# Patient Record
Sex: Female | Born: 1955 | Race: White | Hispanic: No | Marital: Married | Smoking: Current some day smoker
Health system: Southern US, Community
[De-identification: ages and names within clinical notes are randomized; demographics above are authoritative.]

## PROBLEM LIST (undated history)

## (undated) DIAGNOSIS — I10 Essential (primary) hypertension: Secondary | ICD-10-CM

## (undated) DIAGNOSIS — F419 Anxiety disorder, unspecified: Secondary | ICD-10-CM

## (undated) DIAGNOSIS — N189 Chronic kidney disease, unspecified: Secondary | ICD-10-CM

---

## 2016-01-09 ENCOUNTER — Encounter (HOSPITAL_COMMUNITY): Payer: Self-pay | Admitting: *Deleted

## 2016-01-09 ENCOUNTER — Emergency Department (HOSPITAL_COMMUNITY): Payer: Medicare HMO | Admitting: Anesthesiology

## 2016-01-09 ENCOUNTER — Emergency Department (HOSPITAL_COMMUNITY): Payer: Medicare HMO

## 2016-01-09 ENCOUNTER — Encounter (HOSPITAL_COMMUNITY)
Admission: EM | Disposition: A | Discharge status: Skilled Nursing Facility | Payer: Self-pay | Source: Home / Self Care | Attending: Orthopedic Surgery

## 2016-01-09 ENCOUNTER — Inpatient Hospital Stay (HOSPITAL_COMMUNITY)
Admission: EM | Admit: 2016-01-09 | Discharge: 2016-01-17 | DRG: 501 | Disposition: A | Discharge status: Skilled Nursing Facility | Payer: Medicare HMO | Attending: Orthopedic Surgery | Admitting: Orthopedic Surgery

## 2016-01-09 DIAGNOSIS — F411 Generalized anxiety disorder: Secondary | ICD-10-CM | POA: Diagnosis present

## 2016-01-09 DIAGNOSIS — W19XXXA Unspecified fall, initial encounter: Secondary | ICD-10-CM

## 2016-01-09 DIAGNOSIS — Y92009 Unspecified place in unspecified non-institutional (private) residence as the place of occurrence of the external cause: Secondary | ICD-10-CM

## 2016-01-09 DIAGNOSIS — S52502B Unspecified fracture of the lower end of left radius, initial encounter for open fracture type I or II: Principal | ICD-10-CM | POA: Diagnosis present

## 2016-01-09 DIAGNOSIS — N183 Chronic kidney disease, stage 3 unspecified: Secondary | ICD-10-CM | POA: Diagnosis present

## 2016-01-09 DIAGNOSIS — R296 Repeated falls: Secondary | ICD-10-CM | POA: Diagnosis present

## 2016-01-09 DIAGNOSIS — S52009A Unspecified fracture of upper end of unspecified ulna, initial encounter for closed fracture: Secondary | ICD-10-CM | POA: Diagnosis present

## 2016-01-09 DIAGNOSIS — S52109A Unspecified fracture of upper end of unspecified radius, initial encounter for closed fracture: Secondary | ICD-10-CM

## 2016-01-09 DIAGNOSIS — F329 Major depressive disorder, single episode, unspecified: Secondary | ICD-10-CM | POA: Diagnosis present

## 2016-01-09 DIAGNOSIS — I129 Hypertensive chronic kidney disease with stage 1 through stage 4 chronic kidney disease, or unspecified chronic kidney disease: Secondary | ICD-10-CM | POA: Diagnosis present

## 2016-01-09 DIAGNOSIS — S52002B Unspecified fracture of upper end of left ulna, initial encounter for open fracture type I or II: Secondary | ICD-10-CM | POA: Diagnosis present

## 2016-01-09 DIAGNOSIS — E7800 Pure hypercholesterolemia, unspecified: Secondary | ICD-10-CM | POA: Diagnosis present

## 2016-01-09 DIAGNOSIS — F419 Anxiety disorder, unspecified: Secondary | ICD-10-CM | POA: Diagnosis present

## 2016-01-09 DIAGNOSIS — F17200 Nicotine dependence, unspecified, uncomplicated: Secondary | ICD-10-CM | POA: Diagnosis present

## 2016-01-09 DIAGNOSIS — F1910 Other psychoactive substance abuse, uncomplicated: Secondary | ICD-10-CM | POA: Diagnosis present

## 2016-01-09 DIAGNOSIS — S52202B Unspecified fracture of shaft of left ulna, initial encounter for open fracture type I or II: Secondary | ICD-10-CM

## 2016-01-09 DIAGNOSIS — Z87820 Personal history of traumatic brain injury: Secondary | ICD-10-CM

## 2016-01-09 DIAGNOSIS — S5292XB Unspecified fracture of left forearm, initial encounter for open fracture type I or II: Secondary | ICD-10-CM

## 2016-01-09 DIAGNOSIS — W109XXA Fall (on) (from) unspecified stairs and steps, initial encounter: Secondary | ICD-10-CM | POA: Diagnosis present

## 2016-01-09 DIAGNOSIS — N179 Acute kidney failure, unspecified: Secondary | ICD-10-CM | POA: Diagnosis present

## 2016-01-09 DIAGNOSIS — F0390 Unspecified dementia without behavioral disturbance: Secondary | ICD-10-CM | POA: Diagnosis present

## 2016-01-09 DIAGNOSIS — I10 Essential (primary) hypertension: Secondary | ICD-10-CM | POA: Diagnosis present

## 2016-01-09 DIAGNOSIS — G4700 Insomnia, unspecified: Secondary | ICD-10-CM | POA: Diagnosis present

## 2016-01-09 HISTORY — DX: Anxiety disorder, unspecified: F41.9

## 2016-01-09 HISTORY — PX: I & D EXTREMITY: SHX5045

## 2016-01-09 HISTORY — PX: ORIF WRIST FRACTURE: SHX2133

## 2016-01-09 HISTORY — DX: Chronic kidney disease, unspecified: N18.9

## 2016-01-09 HISTORY — DX: Essential (primary) hypertension: I10

## 2016-01-09 LAB — CBC WITH DIFFERENTIAL/PLATELET
BASOS ABS: 0 10*3/uL (ref 0.0–0.1)
BASOS PCT: 1 %
EOS ABS: 0.2 10*3/uL (ref 0.0–0.7)
Eosinophils Relative: 2 %
HEMATOCRIT: 43.7 % (ref 36.0–46.0)
HEMOGLOBIN: 13.7 g/dL (ref 12.0–15.0)
Lymphocytes Relative: 25 %
Lymphs Abs: 2.2 10*3/uL (ref 0.7–4.0)
MCH: 29 pg (ref 26.0–34.0)
MCHC: 31.4 g/dL (ref 30.0–36.0)
MCV: 92.4 fL (ref 78.0–100.0)
MONOS PCT: 5 %
Monocytes Absolute: 0.5 10*3/uL (ref 0.1–1.0)
NEUTROS ABS: 5.8 10*3/uL (ref 1.7–7.7)
NEUTROS PCT: 67 %
Platelets: 255 10*3/uL (ref 150–400)
RBC: 4.73 MIL/uL (ref 3.87–5.11)
RDW: 13.9 % (ref 11.5–15.5)
WBC: 8.7 10*3/uL (ref 4.0–10.5)

## 2016-01-09 LAB — BASIC METABOLIC PANEL
ANION GAP: 13 (ref 5–15)
BUN: 19 mg/dL (ref 6–20)
CHLORIDE: 105 mmol/L (ref 101–111)
CO2: 22 mmol/L (ref 22–32)
Calcium: 9.1 mg/dL (ref 8.9–10.3)
Creatinine, Ser: 1.27 mg/dL — ABNORMAL HIGH (ref 0.44–1.00)
GFR calc non Af Amer: 45 mL/min — ABNORMAL LOW (ref >60)
GFR, EST AFRICAN AMERICAN: 52 mL/min — AB (ref >60)
Glucose, Bld: 135 mg/dL — ABNORMAL HIGH (ref 65–99)
POTASSIUM: 3.7 mmol/L (ref 3.5–5.1)
SODIUM: 140 mmol/L (ref 135–145)

## 2016-01-09 SURGERY — OPEN REDUCTION INTERNAL FIXATION (ORIF) WRIST FRACTURE
Anesthesia: General | Site: Wrist | Laterality: Left

## 2016-01-09 MED ORDER — OXYCODONE HCL 5 MG PO TABS
ORAL_TABLET | ORAL | Status: AC
Start: 2016-01-09 — End: 2016-01-09
  Filled 2016-01-09: qty 2

## 2016-01-09 MED ORDER — ONDANSETRON HCL 4 MG/2ML IJ SOLN
INTRAMUSCULAR | Status: AC
  Filled 2016-01-09: qty 2

## 2016-01-09 MED ORDER — PROPOFOL 10 MG/ML IV BOLUS
INTRAVENOUS | Status: AC
  Filled 2016-01-09: qty 20

## 2016-01-09 MED ORDER — HYDROMORPHONE HCL 1 MG/ML IJ SOLN
0.5000 mg | Freq: Once | INTRAMUSCULAR | Status: AC
  Administered 2016-01-09: 0.5 mg via INTRAVENOUS
  Filled 2016-01-09: qty 1

## 2016-01-09 MED ORDER — OXYCODONE HCL 5 MG/5ML PO SOLN: 5.0000 mg | Freq: Once | ORAL | Status: DC | PRN

## 2016-01-09 MED ORDER — PROPOFOL 10 MG/ML IV BOLUS
INTRAVENOUS | Status: DC | PRN
  Administered 2016-01-09: 200 mg via INTRAVENOUS

## 2016-01-09 MED ORDER — OXYCODONE HCL 5 MG PO TABS
5.0000 mg | ORAL_TABLET | ORAL | Status: DC | PRN
  Administered 2016-01-09 – 2016-01-16 (×20): 10 mg via ORAL
  Filled 2016-01-09 (×20): qty 2

## 2016-01-09 MED ORDER — TRAZODONE HCL 100 MG PO TABS
100.0000 mg | ORAL_TABLET | Freq: Every day | ORAL | Status: DC
  Administered 2016-01-10 – 2016-01-16 (×7): 100 mg via ORAL
  Filled 2016-01-09 (×7): qty 1

## 2016-01-09 MED ORDER — LACTATED RINGERS IV SOLN
INTRAVENOUS | Status: DC | PRN
  Administered 2016-01-09 (×2): via INTRAVENOUS

## 2016-01-09 MED ORDER — HYDROCHLOROTHIAZIDE 25 MG PO TABS: 25.0000 mg | ORAL_TABLET | Freq: Every day | ORAL | Status: DC

## 2016-01-09 MED ORDER — SUCCINYLCHOLINE CHLORIDE 20 MG/ML IJ SOLN
INTRAMUSCULAR | Status: AC
  Filled 2016-01-09: qty 1

## 2016-01-09 MED ORDER — LIDOCAINE HCL (CARDIAC) 20 MG/ML IV SOLN
INTRAVENOUS | Status: AC
  Filled 2016-01-09: qty 5

## 2016-01-09 MED ORDER — AMLODIPINE BESYLATE 10 MG PO TABS
10.0000 mg | ORAL_TABLET | Freq: Every day | ORAL | Status: DC
  Administered 2016-01-10 – 2016-01-16 (×6): 10 mg via ORAL
  Filled 2016-01-09 (×7): qty 1

## 2016-01-09 MED ORDER — FENTANYL CITRATE (PF) 250 MCG/5ML IJ SOLN
INTRAMUSCULAR | Status: AC
  Filled 2016-01-09: qty 5

## 2016-01-09 MED ORDER — SODIUM CHLORIDE 0.9 % IR SOLN
Status: DC | PRN
  Administered 2016-01-09 (×2): 3000 mL
  Administered 2016-01-09: 1000 mL
  Administered 2016-01-09 (×2): 3000 mL

## 2016-01-09 MED ORDER — MIDAZOLAM HCL 2 MG/2ML IJ SOLN
INTRAMUSCULAR | Status: AC
  Filled 2016-01-09: qty 2

## 2016-01-09 MED ORDER — SUCCINYLCHOLINE CHLORIDE 20 MG/ML IJ SOLN
INTRAMUSCULAR | Status: DC | PRN
  Administered 2016-01-09: 120 mg via INTRAVENOUS

## 2016-01-09 MED ORDER — FENTANYL CITRATE (PF) 250 MCG/5ML IJ SOLN
INTRAMUSCULAR | Status: DC | PRN
  Administered 2016-01-09: 100 ug via INTRAVENOUS
  Administered 2016-01-09: 50 ug via INTRAVENOUS
  Administered 2016-01-09: 100 ug via INTRAVENOUS

## 2016-01-09 MED ORDER — OXYCODONE HCL 5 MG PO TABS: 5.0000 mg | ORAL_TABLET | Freq: Once | ORAL | Status: DC | PRN

## 2016-01-09 MED ORDER — EPHEDRINE SULFATE 50 MG/ML IJ SOLN
INTRAMUSCULAR | Status: DC | PRN
  Administered 2016-01-09 (×2): 10 mg via INTRAVENOUS

## 2016-01-09 MED ORDER — PRAVASTATIN SODIUM 40 MG PO TABS
40.0000 mg | ORAL_TABLET | Freq: Every day | ORAL | Status: DC
  Administered 2016-01-10 – 2016-01-16 (×7): 40 mg via ORAL
  Filled 2016-01-09 (×7): qty 1

## 2016-01-09 MED ORDER — HYDROMORPHONE HCL 1 MG/ML IJ SOLN: 0.2500 mg | INTRAMUSCULAR | Status: DC | PRN

## 2016-01-09 MED ORDER — IRBESARTAN 300 MG PO TABS
300.0000 mg | ORAL_TABLET | Freq: Every day | ORAL | Status: DC
  Administered 2016-01-10: 300 mg via ORAL
  Filled 2016-01-09: qty 1

## 2016-01-09 MED ORDER — ONDANSETRON HCL 4 MG/2ML IJ SOLN
INTRAMUSCULAR | Status: DC | PRN
  Administered 2016-01-09: 4 mg via INTRAVENOUS

## 2016-01-09 MED ORDER — LIDOCAINE HCL (CARDIAC) 20 MG/ML IV SOLN
INTRAVENOUS | Status: DC | PRN
  Administered 2016-01-09: 100 mg via INTRATRACHEAL

## 2016-01-09 MED ORDER — TETANUS-DIPHTH-ACELL PERTUSSIS 5-2.5-18.5 LF-MCG/0.5 IM SUSP
0.5000 mL | Freq: Once | INTRAMUSCULAR | Status: AC
  Administered 2016-01-09: 0.5 mL via INTRAMUSCULAR
  Filled 2016-01-09: qty 0.5

## 2016-01-09 MED ORDER — MEPERIDINE HCL 25 MG/ML IJ SOLN: 6.2500 mg | INTRAMUSCULAR | Status: DC | PRN

## 2016-01-09 MED ORDER — CEFAZOLIN SODIUM-DEXTROSE 2-4 GM/100ML-% IV SOLN
2.0000 g | Freq: Once | INTRAVENOUS | Status: AC
  Administered 2016-01-09: 2 g via INTRAVENOUS
  Filled 2016-01-09: qty 100

## 2016-01-09 SURGICAL SUPPLY — 82 items
BANDAGE ELASTIC 3 VELCRO ST LF (GAUZE/BANDAGES/DRESSINGS) ×3 IMPLANT
BANDAGE ELASTIC 4 VELCRO ST LF (GAUZE/BANDAGES/DRESSINGS) ×3 IMPLANT
BIT DRILL 2.2 SS TIBIAL (BIT) ×6 IMPLANT
BLADE SURG ROTATE 9660 (MISCELLANEOUS) IMPLANT
BNDG CONFORM 2 STRL LF (GAUZE/BANDAGES/DRESSINGS) IMPLANT
BNDG ESMARK 4X9 LF (GAUZE/BANDAGES/DRESSINGS) ×3 IMPLANT
BNDG GAUZE ELAST 4 BULKY (GAUZE/BANDAGES/DRESSINGS) ×3 IMPLANT
CANISTER SUCTION 2500CC (MISCELLANEOUS) ×3 IMPLANT
CORDS BIPOLAR (ELECTRODE) ×3 IMPLANT
COVER SURGICAL LIGHT HANDLE (MISCELLANEOUS) ×3 IMPLANT
CUFF TOURNIQUET SINGLE 18IN (TOURNIQUET CUFF) ×3 IMPLANT
CUFF TOURNIQUET SINGLE 24IN (TOURNIQUET CUFF) IMPLANT
DRAIN TLS ROUND 10FR (DRAIN) ×3 IMPLANT
DRAPE OEC MINIVIEW 54X84 (DRAPES) IMPLANT
DRAPE SURG 17X23 STRL (DRAPES) ×3 IMPLANT
DRSG ADAPTIC 3X8 NADH LF (GAUZE/BANDAGES/DRESSINGS) ×3 IMPLANT
GAUZE SPONGE 4X4 12PLY STRL (GAUZE/BANDAGES/DRESSINGS) ×3 IMPLANT
GAUZE XEROFORM 1X8 LF (GAUZE/BANDAGES/DRESSINGS) IMPLANT
GAUZE XEROFORM 5X9 LF (GAUZE/BANDAGES/DRESSINGS) ×3 IMPLANT
GLOVE BIOGEL M 8.0 STRL (GLOVE) ×3 IMPLANT
GLOVE SS BIOGEL STRL SZ 8 (GLOVE) ×1 IMPLANT
GLOVE SUPERSENSE BIOGEL SZ 8 (GLOVE) ×2
GOWN STRL REUS W/ TWL LRG LVL3 (GOWN DISPOSABLE) ×1 IMPLANT
GOWN STRL REUS W/ TWL XL LVL3 (GOWN DISPOSABLE) ×2 IMPLANT
GOWN STRL REUS W/TWL LRG LVL3 (GOWN DISPOSABLE) ×2
GOWN STRL REUS W/TWL XL LVL3 (GOWN DISPOSABLE) ×4
HANDPIECE INTERPULSE COAX TIP (DISPOSABLE)
KIT BASIN OR (CUSTOM PROCEDURE TRAY) ×3 IMPLANT
KIT ROOM TURNOVER OR (KITS) ×3 IMPLANT
LOOP VESSEL MAXI BLUE (MISCELLANEOUS) IMPLANT
MANIFOLD NEPTUNE II (INSTRUMENTS) ×3 IMPLANT
NEEDLE 22X1 1/2 (OR ONLY) (NEEDLE) IMPLANT
NEEDLE HYPO 25GX1X1/2 BEV (NEEDLE) IMPLANT
NS IRRIG 1000ML POUR BTL (IV SOLUTION) ×3 IMPLANT
PACK ORTHO EXTREMITY (CUSTOM PROCEDURE TRAY) ×3 IMPLANT
PAD ARMBOARD 7.5X6 YLW CONV (MISCELLANEOUS) ×6 IMPLANT
PAD CAST 3X4 CTTN HI CHSV (CAST SUPPLIES) ×1 IMPLANT
PAD CAST 4YDX4 CTTN HI CHSV (CAST SUPPLIES) ×1 IMPLANT
PADDING CAST COTTON 3X4 STRL (CAST SUPPLIES) ×2
PADDING CAST COTTON 4X4 STRL (CAST SUPPLIES) ×2
PEG LOCKING SMOOTH 2.2X20 (Screw) ×6 IMPLANT
PILLOW ARM CARTER ADULT (MISCELLANEOUS) ×3 IMPLANT
PLATE DVR VOLAR RIM LEFT (Plate) ×3 IMPLANT
PUTTY DBM STAGRAFT PLUS 5CC (Putty) ×3 IMPLANT
SCREW LOCK 12X2.7X 3 LD (Screw) ×1 IMPLANT
SCREW LOCK 14X2.7X 3 LD TPR (Screw) ×1 IMPLANT
SCREW LOCK 16X2.7X 3 LD TPR (Screw) ×1 IMPLANT
SCREW LOCK 20X2.7X 3 LD TPR (Screw) ×2 IMPLANT
SCREW LOCK 24X2.7X3 LD THRD (Screw) ×1 IMPLANT
SCREW LOCKING 2.7X12MM (Screw) ×2 IMPLANT
SCREW LOCKING 2.7X13MM (Screw) ×9 IMPLANT
SCREW LOCKING 2.7X14 (Screw) ×2 IMPLANT
SCREW LOCKING 2.7X16 (Screw) ×2 IMPLANT
SCREW LOCKING 2.7X20MM (Screw) ×4 IMPLANT
SCREW LOCKING 2.7X24MM (Screw) ×2 IMPLANT
SCREW LOCKING 2.7X8MM (Screw) ×3 IMPLANT
SCREW MULTI DIRECTIONAL 2.7X22 (Screw) ×3 IMPLANT
SET HNDPC FAN SPRY TIP SCT (DISPOSABLE) IMPLANT
SPLINT FIBERGLASS 3X35 (CAST SUPPLIES) ×3 IMPLANT
SPONGE LAP 4X18 X RAY DECT (DISPOSABLE) ×3 IMPLANT
SUT CHROMIC 4 0 PS 2 18 (SUTURE) ×3 IMPLANT
SUT FIBERWIRE 3-0 18 TAPR NDL (SUTURE) ×3
SUT MNCRL AB 4-0 PS2 18 (SUTURE) IMPLANT
SUT PROLENE 3 0 PS 2 (SUTURE) ×3 IMPLANT
SUT PROLENE 4 0 PS 2 18 (SUTURE) ×12 IMPLANT
SUT VIC AB 2-0 CT1 27 (SUTURE) ×4
SUT VIC AB 2-0 CT1 TAPERPNT 27 (SUTURE) ×2 IMPLANT
SUT VIC AB 3-0 FS2 27 (SUTURE) IMPLANT
SUT VIC AB 3-0 SH 27 (SUTURE) ×2
SUT VIC AB 3-0 SH 27X BRD (SUTURE) ×1 IMPLANT
SUT VICRYL 4-0 PS2 18IN ABS (SUTURE) ×3 IMPLANT
SUTURE FIBERWR 3-0 18 TAPR NDL (SUTURE) ×1 IMPLANT
SYR CONTROL 10ML LL (SYRINGE) IMPLANT
SYSTEM CHEST DRAIN TLS 7FR (DRAIN) IMPLANT
TOWEL OR 17X24 6PK STRL BLUE (TOWEL DISPOSABLE) ×6 IMPLANT
TOWEL OR 17X26 10 PK STRL BLUE (TOWEL DISPOSABLE) ×6 IMPLANT
TUBE ANAEROBIC SPECIMEN COL (MISCELLANEOUS) IMPLANT
TUBE CONNECTING 12'X1/4 (SUCTIONS) ×1
TUBE CONNECTING 12X1/4 (SUCTIONS) ×2 IMPLANT
TUBE EVACUATION TLS (MISCELLANEOUS) ×3 IMPLANT
WATER STERILE IRR 1000ML POUR (IV SOLUTION) IMPLANT
YANKAUER SUCT BULB TIP NO VENT (SUCTIONS) ×3 IMPLANT

## 2016-01-09 NOTE — ED Notes (Signed)
Pt arrives from home via [facility]. Pt states she went to pick something off the ground and fell backwards onto her left arm. Pt has an open injury to left arm with what appears to be a bone shard protruding from her arm. Pt [place] and has had difficulty ambulating since then.

## 2016-01-09 NOTE — ED Notes (Signed)
[facility] Charity fundraiserN.
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
## 2016-01-09 NOTE — ED Notes (Signed)
Pt undressed, clothing removed. Jewelry removed. Everything given to husband. Pt in gown, ancef at bedside.

## 2016-01-09 NOTE — Anesthesia Postprocedure Evaluation (Signed)
Anesthesia Post Note  Patient: Elizabeth Beck  Procedure(s) Performed: Procedure(s) (LRB): OPEN REDUCTION INTERNAL FIXATION (ORIF) WRIST  FRACTURE (Left) IRRIGATION AND DEBRIDEMENT LEFT WRIST EXTREMITY (Left)  Patient location during evaluation: PACU Anesthesia Type: General Level of consciousness: awake and alert Pain management: pain level controlled Vital Signs Assessment: post-procedure vital signs reviewed and stable Respiratory status: spontaneous breathing, nonlabored ventilation, respiratory function stable and patient connected to nasal cannula oxygen Cardiovascular status: blood pressure returned to baseline and stable Postop Assessment: no signs of nausea or vomiting Anesthetic complications: no    Last Vitals:  Filed Vitals:   01/09/16 2322 01/09/16 2338  BP: 132/89 138/89  Pulse:  86  Temp:    Resp: 16 18    Last Pain:  Filed Vitals:   01/09/16 2344  PainSc: 5                  Shariah Assad A

## 2016-01-09 NOTE — Op Note (Signed)
See [id number]#[id number] Amanda PeaGramig MD
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
## 2016-01-09 NOTE — Transfer of Care (Signed)
Immediate Anesthesia Transfer of Care Note  Patient: Elizabeth Beck  Procedure(s) Performed: Procedure(s): OPEN REDUCTION INTERNAL FIXATION (ORIF) WRIST  FRACTURE (Left) IRRIGATION AND DEBRIDEMENT LEFT WRIST EXTREMITY (Left)  Patient Location: PACU  Anesthesia Type:General  Level of Consciousness: awake, alert  and oriented  Airway & Oxygen Therapy: Patient connected to nasal cannula oxygen  Post-op Assessment: Report given to RN and Post -op Vital signs reviewed and stable  Post vital signs: Reviewed and stable  Last Vitals:  Filed Vitals:   01/09/16 1815 01/09/16 1845  BP: 115/73 112/78  Pulse: 65 68  Temp:      Complications: No apparent anesthesia complications

## 2016-01-09 NOTE — Anesthesia Procedure Notes (Signed)
Procedure Name: Intubation Date/Time: 01/09/2016 8:19 PM Performed by: Molli HazardGORDON, Kaiyana Bedore M Pre-anesthesia Checklist: Patient identified, Emergency Drugs available, Suction available and Patient being monitored Patient Re-evaluated:Patient Re-evaluated prior to inductionOxygen Delivery Method: Circle system utilized Preoxygenation: Pre-oxygenation with 100% oxygen Intubation Type: IV induction, Rapid sequence and Cricoid Pressure applied Laryngoscope Size: Miller and 2 Grade View: Grade I Tube type: Oral Tube size: 7.5 mm Number of attempts: 1 Airway Equipment and Method: Stylet Placement Confirmation: ETT inserted through vocal cords under direct vision,  positive ETCO2 and breath sounds checked- equal and bilateral Secured at: 23 cm Tube secured with: Tape Dental Injury: Teeth and Oropharynx as per pre-operative assessment

## 2016-01-09 NOTE — ED Notes (Signed)
Ancef put on bed. Pharmacy informed this RN to not start antibiotics until pt is ready for surgery.
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
## 2016-01-10 DIAGNOSIS — N183 Chronic kidney disease, stage 3 unspecified: Secondary | ICD-10-CM | POA: Diagnosis present

## 2016-01-10 DIAGNOSIS — F39 Unspecified mood [affective] disorder: Secondary | ICD-10-CM

## 2016-01-10 DIAGNOSIS — E785 Hyperlipidemia, unspecified: Secondary | ICD-10-CM

## 2016-01-10 DIAGNOSIS — F411 Generalized anxiety disorder: Secondary | ICD-10-CM | POA: Diagnosis present

## 2016-01-10 DIAGNOSIS — F1910 Other psychoactive substance abuse, uncomplicated: Secondary | ICD-10-CM | POA: Diagnosis present

## 2016-01-10 DIAGNOSIS — I10 Essential (primary) hypertension: Secondary | ICD-10-CM | POA: Diagnosis not present

## 2016-01-10 LAB — BASIC METABOLIC PANEL
ANION GAP: 10 (ref 5–15)
BUN: 8 mg/dL (ref 6–20)
CHLORIDE: 104 mmol/L (ref 101–111)
CO2: 27 mmol/L (ref 22–32)
Calcium: 8.6 mg/dL — ABNORMAL LOW (ref 8.9–10.3)
Creatinine, Ser: 0.88 mg/dL (ref 0.44–1.00)
GFR calc non Af Amer: >60 mL/min (ref >60)
Glucose, Bld: 150 mg/dL — ABNORMAL HIGH (ref 65–99)
POTASSIUM: 3.8 mmol/L (ref 3.5–5.1)
Sodium: 141 mmol/L (ref 135–145)

## 2016-01-10 LAB — RAPID URINE DRUG SCREEN, HOSP PERFORMED
AMPHETAMINES: NOT DETECTED
BARBITURATES: NOT DETECTED
BENZODIAZEPINES: NOT DETECTED
Cocaine: POSITIVE — AB
Opiates: POSITIVE — AB
TETRAHYDROCANNABINOL: POSITIVE — AB

## 2016-01-10 LAB — GENTAMICIN LEVEL, RANDOM: Gentamicin Rm: 3.3 ug/mL

## 2016-01-10 MED ORDER — ALPRAZOLAM 0.5 MG PO TABS
0.5000 mg | ORAL_TABLET | Freq: Four times a day (QID) | ORAL | Status: DC | PRN
  Administered 2016-01-14 – 2016-01-15 (×2): 0.5 mg via ORAL
  Filled 2016-01-10 (×3): qty 1

## 2016-01-10 MED ORDER — GENTAMICIN SULFATE 40 MG/ML IJ SOLN
480.0000 mg | Freq: Once | INTRAVENOUS | Status: AC
  Administered 2016-01-10: 480 mg via INTRAVENOUS
  Filled 2016-01-10: qty 12

## 2016-01-10 MED ORDER — GENTAMICIN SULFATE 40 MG/ML IJ SOLN
480.0000 mg | INTRAVENOUS | Status: DC
  Administered 2016-01-11 – 2016-01-16 (×6): 480 mg via INTRAVENOUS
  Filled 2016-01-10 (×8): qty 12

## 2016-01-10 MED ORDER — HYDROMORPHONE HCL 1 MG/ML IJ SOLN
0.5000 mg | INTRAMUSCULAR | Status: DC | PRN
  Administered 2016-01-10: 1 mg via INTRAVENOUS
  Filled 2016-01-10 (×2): qty 1

## 2016-01-10 MED ORDER — DIPHENHYDRAMINE HCL 25 MG PO CAPS: 25.0000 mg | ORAL_CAPSULE | Freq: Four times a day (QID) | ORAL | Status: DC | PRN

## 2016-01-10 MED ORDER — PROMETHAZINE HCL 25 MG RE SUPP
12.5000 mg | Freq: Four times a day (QID) | RECTAL | Status: DC | PRN
  Filled 2016-01-10: qty 1

## 2016-01-10 MED ORDER — SENNA 8.6 MG PO TABS: 1.0000 | ORAL_TABLET | Freq: Two times a day (BID) | ORAL | Status: DC

## 2016-01-10 MED ORDER — FAMOTIDINE 20 MG PO TABS
20.0000 mg | ORAL_TABLET | Freq: Two times a day (BID) | ORAL | Status: DC | PRN
  Administered 2016-01-12: 20 mg via ORAL
  Filled 2016-01-10: qty 1

## 2016-01-10 MED ORDER — METHOCARBAMOL 1000 MG/10ML IJ SOLN
500.0000 mg | Freq: Four times a day (QID) | INTRAMUSCULAR | Status: DC | PRN
  Filled 2016-01-10: qty 5

## 2016-01-10 MED ORDER — SENNA 8.6 MG PO TABS
1.0000 | ORAL_TABLET | Freq: Two times a day (BID) | ORAL | Status: DC
  Administered 2016-01-10 – 2016-01-16 (×14): 8.6 mg via ORAL
  Filled 2016-01-10 (×14): qty 1

## 2016-01-10 MED ORDER — CEFAZOLIN SODIUM 1-5 GM-% IV SOLN
1.0000 g | Freq: Three times a day (TID) | INTRAVENOUS | Status: DC
  Administered 2016-01-10 – 2016-01-17 (×21): 1 g via INTRAVENOUS
  Filled 2016-01-10 (×26): qty 50

## 2016-01-10 MED ORDER — ONDANSETRON HCL 4 MG PO TABS
4.0000 mg | ORAL_TABLET | Freq: Four times a day (QID) | ORAL | Status: DC | PRN
Start: 2016-01-10 — End: 2016-01-17

## 2016-01-10 MED ORDER — ONDANSETRON HCL 4 MG/2ML IJ SOLN
4.0000 mg | Freq: Four times a day (QID) | INTRAMUSCULAR | Status: DC | PRN
  Administered 2016-01-11: 4 mg via INTRAVENOUS
  Filled 2016-01-10: qty 2

## 2016-01-10 MED ORDER — METHOCARBAMOL 500 MG PO TABS
500.0000 mg | ORAL_TABLET | Freq: Four times a day (QID) | ORAL | Status: DC | PRN
  Administered 2016-01-10 – 2016-01-16 (×8): 500 mg via ORAL
  Filled 2016-01-10 (×9): qty 1

## 2016-01-10 MED ORDER — LACTATED RINGERS IV SOLN
INTRAVENOUS | Status: DC
  Administered 2016-01-10: 02:00:00 via INTRAVENOUS

## 2016-01-10 NOTE — Progress Notes (Signed)
Orthopedic Tech Progress Note Patient Details:  Elizabeth Beck 01/26/1956 [id number] Applied Carter arm elevator pillow to LUE. Patient ID: Elizabeth Beck, female   DOB: 10/25/1955, 60 y.o.   MRN: [id number]   Lesle ChrisGilliland, Janit Cutter L 01/10/2016, 3:55 PM
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
## 2016-01-10 NOTE — Progress Notes (Signed)
Subjective: 1 Day Post-Op Procedure(s) (LRB): OPEN REDUCTION INTERNAL FIXATION (ORIF) WRIST  FRACTURE (Left) IRRIGATION AND DEBRIDEMENT LEFT WRIST EXTREMITY (Left) Patient reports pain as moderate She is alert and oriented. I remove the drain at bedside.  She has no new complaints.  Her fingers are sensate according to her subjective report. She has limited motion at present juncture due to pain. She has had fasciotomy carpal tunnel release and ORIF of a very comminuted fracture.  She denies abdominal pain complaints.  Objective: Vital signs in last 24 hours: Temp:  [97.5 F (36.4 C)-98.6 F (37 C)] 98.1 F (36.7 C) (04/14 1300) Pulse Rate:  [65-95] 88 (04/14 1300) Resp:  [15-19] 15 (04/14 1300) BP: (112-138)/(73-89) 136/82 mmHg (04/14 1300) SpO2:  [90 %-98 %] 97 % (04/14 1300) Weight:  [90.719 kg (200 lb)] 90.719 kg (200 lb) (04/13 1812)  Intake/Output from previous day: 04/13 0701 - 04/14 0700 In: 2150 [I.V.:2100; IV Piggyback:50] Out: 38 [Urine:1; Drains:12; Blood:25] Intake/Output this shift: Total I/O In: 360 [P.O.:360] Out: -    Recent Labs  01/09/16 1831  HGB 13.7    Recent Labs  01/09/16 1831  WBC 8.7  RBC 4.73  HCT 43.7  PLT 255    Recent Labs  01/09/16 1831  NA 140  K 3.7  CL 105  CO2 22  BUN 19  CREATININE 1.27*  GLUCOSE 135*  CALCIUM 9.1   No results for input(s): LABPT, INR in the last 72 hours. Physical exam: She is alert and oriented no acute distress about signs stable. Drain is removed at bedside. Her fingers are sensate. She has limited motion but does flex and extend the fingers. Chest is clear heart regular rate ABD soft No cellulitis present Compartment soft  Assessment/Plan: 1 Day Post-Op Procedure(s) (LRB): OPEN REDUCTION INTERNAL FIXATION (ORIF) WRIST  FRACTURE (Left) IRRIGATION AND DEBRIDEMENT LEFT WRIST EXTREMITY (Left) Advance diet Up with therapy  Will plan for continued postop care.  We'll plan for IV  antibiotics. I want her to have 48-72 hours of IV antibiotics due to the open nature of her fractures.  I feel that she is in for a long-haul given her issues and medical challenges as well as cognitive abilities.  Elevate range of motion therapy for finger  motion and edema control is the rule.  I discussed her all issues do's and don'ts.  I removed her drain at bedside.  We'll continue her postop care plan  Patient Active Problem List   Diagnosis Date Noted  . Fracture, radius and ulna, proximal 01/09/2016    [name],Elizabeth Beck M 01/10/2016, 2:02 PM

## 2016-01-10 NOTE — Op Note (Signed)
NAMPura Spice:  Elizabeth Beck, Elizabeth Beck                 ACCOUNT NO.:  [id number]  MEDICAL RECORD NO.:  [id number]  LOCATION:  [facility]                         FACILITY:  [facility]  PHYSICIAN:  Dionne AnoWilliam M. Thayne Cindric, M.D.DATE OF BIRTH:  03-Aug-1956  DATE OF PROCEDURE: DATE OF DISCHARGE:                              OPERATIVE REPORT   PREOPERATIVE DIAGNOSES: 1. Comminuted complex greater than 3-part intra-articular distal     radius fracture, type 1, open in nature, left wrist. 2. Type 1 open distal ulna fracture, left wrist.  POSTOPERATIVE DIAGNOSES: 1. Comminuted complex greater than 3-part intra-articular distal     radius fracture, type 1, open in nature, left wrist. 2. Type 1 open distal ulna fracture, left wrist.  PROCEDURES: 1. Irrigation and debridement of open fracture.  This was an     excisional debridement of skin, subcutaneous tissue, muscle, bone,     and associated soft tissue architecture.  This was performed with     knife, blade, curette and scissor about the open radius fracture. 2. Irrigation and debridement of type 1 open ulnar fracture through     separate incision.  This included skin, subcutaneous tissue,     muscle, tendon, bone performed with knife, blade, curette and     scissor. 3. Open reduction and internal fixation of comminuted complex intra-     articular distal radius fracture, greater than 3-part with     Staygraft and an extended volar rim plate from [employer]. 4. Open carpal tunnel release, left wrist. 5. Open treatment, distal ulna fracture, left wrist. 6. Radical extensor tenolysis, tenosynovectomy and release of the     extensor sheath, left wrist with retinacular transposition to the     dorsal portion of the radius. 7. AP, lateral, and oblique x-rays performed, examined, and     interpreted by myself.  SURGEON:  Dionne AnoWilliam M. Amanda PeaGramig, M.D.  ASSISTANT:  Karie ChimeraBrian Buchanan, P.A.-C.  COMPLICATIONS:  None.  ANESTHESIA:  General.  TOURNIQUET TIME:  Less than 2  hours.  INDICATIONS:  The patient is a 60 year old female, who has history of multiple medical issues.  She has a history of drug addiction and other issues.  We have noted that she has open fractures.  We have prepped urgently for surgery.  She understands the risks and benefits of surgery including the risk of infection, bleeding, anesthesia, damage to normal structures, and failure of surgery to accomplish its intended goals of relieving symptoms and restoring function.  With this in mind, she desires to proceed.  All questions have been encouraged and answered preoperatively.  OPERATIVE PROCEDURE:  The patient was seen by myself and Anesthesia, taken to the operating suite, underwent smooth induction of general anesthesia.  Preoperative antibiotics were given.  She was prepped and draped in usual sterile fashion with Hibiclens scrub performed by myself, followed by 10 minutes of surgical Betadine scrub performed by Mr. Karie ChimeraBrian Buchanan, P.A.-C.  Sterile field was secured.  Arm was very carefully held.  Following this, the patient underwent incision of the skin about the open fracture volarly.  Tourniquet was inflated, 1-mm rim of skin was removed.  Following this, I dissected down and made extensions of the skin  incision volarly.  Fracture was accessed. Fracture penetrated through the volar floor and the proximal portion of the radius shaft had gone through and created a 1-cm puncture wound. There was somewhat dirty contamination.  The median nerve was carefully protected and underwent neurolysis.  The tendons were swept out of harm's way.  I then exposed the bone and performed irrigation and debridement of skin, subcutaneous tissue, muscle, tendon, bone.  This was a debridement, excisional in nature with knife, blade, curette, and scissor.  Six liters of saline were placed through and through the area.  Following this, the patient had the ulna accessed, a 1-mm rim of skin tissue  was removed from the 1-cm open laceration.  Following this, we dissected down and exposed the bone and performed excisional debridement of skin, subcutaneous tissue, muscle, tendon, this was performed with knife, blade, curette and scissor.  This was a 1-cm depth.  Following this, 6 liters were placed through and through the area.  Drapes were changed.  Following this, we then performed very careful and cautious open treatment of the distal ulna with setting technique.  Following this, we performed carpal tunnel release, open in nature given the tremendous capacity for swelling and the approach that we had to use, which was straight midline volar.  Median nerve was carefully protected.  Carpal tunnel was released.  Following this, I looked at Guyon's canal, it looked stable.  I then accessed the fracture.  Pronator was gently handled.  Following this, I performed a large amount of Staygraft from [employer] and an extended volar rim plate.  I cannot emphasize how comminuted this was and how difficult the fracture fragmentation was, but nevertheless, we were able to apply the plate and then placed cerclage Vicryl stitching around some shattered pieces along the metaphyseal-diaphyseal junction. I was able to achieve radial height, inclination, and volar tilt that was satisfactory.  I made sure the screw lengths looked good.  Following this, I irrigated copiously and closed the pronator.  I then made an incision dorsally.  Dissection was carried down.  A step cut in the retinaculum was performed.  The patient had the extensor retinaculum step cut, extensor tenolysis, tenosynovectomy, radical in nature was accomplished and following this, I then transposed the retinaculum to the area just off the periosteum.  This secured the bone and gave a little bit more security in my opinion.  This was inset with FiberWire suture.  The tendons were allowed to remain free.  I deflated the tourniquet,  secured hemostasis, closed the dorsal wound with Prolene. The volar wound was closed with Prolene of the 4-0 and 3-0 variety.  The pronator was closed nicely and I did manage to get a nice coverage of muscle over the bone in hopes that the patient will have good healing capabilities.  I will request a bone stimulator.  I have discussed with the patient all issues postoperatively of course.  All wounds were closed gently, the open areas were left under no tension and had the ability to allow for the egression of fluid.  She tolerated the procedure well.  Excellent refill was noted.  Soft compartments were verified and the patient was placed in long-arm splint with sugar-tong slab extensions.  The patient tolerated this well.  There were no complicating features.  All sponge, needle, and instrument counts were reported as correct.  The patient tolerated the procedure well.  We will admit her for IV antibiotics.  We will ask for evaluation by the  Hospitalist as well and make sure that things goes smoothly for her.  Do's and don'ts have been discussed.  All questions have been encouraged and answered.  We will keep her in a cast for 6 weeks minimum, possibly 8.  Goal is finger range of motion, protection of the repairs as this is a very highly comminuted fracture.  I would begin possibly removable apparatus at 8 weeks and strengthening perhaps at 3 months.  This all be predicated on her x-ray and followup of course.  Do's and don'ts have been discussed.  All questions have been encouraged and answered. Pleasure to see her today and participate in her care plan.     Dionne Ano. Amanda Pea, M.D.     [facility]  D:  01/09/2016  T:  01/10/2016  Job:  [id number]
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
## 2016-01-10 NOTE — Progress Notes (Addendum)
Pharmacy Antibiotic Note  Elizabeth Beck is a 60 y.o. female admitted on 01/09/2016 with open distal ulna fracture of L wrist s/p ORIF on 4/14 am. Pharmacy consulted to dose gentamycin. Noted plans for 48-72 hours of antibiotics.  -SCr= 0.88 and CrCl ~ 75 -a gentamicin level was 3.3 at ~ 4:30pm today (480mg  IV given at 6:20am; ~ 10 hrs post dose)   Plan: -Will continue gentamicin at 480mg  IV q24hr (next dose at 4:30 pm on 4/15) -Will follow patient progress   Height: 5\' 5"  (165.1 cm) Weight: 200 lb (90.719 kg) IBW/kg (Calculated) : 57  Temp (24hrs), Avg:98.3 F (36.8 C), Min:97.5 F (36.4 C), Max:99.8 F (37.7 C)   Recent Labs Lab 01/09/16 1831 01/10/16 1626  WBC 8.7  --   CREATININE 1.27* 0.88  GENTRANDOM  --  3.3    Estimated Creatinine Clearance: 74.7 mL/min (by C-G formula based on Cr of 0.88).    No Known Allergies  Antimicrobials this admission: Gent 4/14 >>    10hr gent level= 3.3 Ancef 4/13 >>    Microbiology results: -no cultures noted   Thank you for allowing pharmacy to be a part of this patient's care.  Harland GermanAndrew Sangita Zani, Pharm D 01/10/2016 10:36 PM

## 2016-01-10 NOTE — Progress Notes (Signed)
Pharmacy Antibiotic Note Elizabeth Beck is a 60 y.o. female admitted on 01/09/2016 with open distal ulna fracture of L wrist s/p ORIF on 4/14 am. Pharmacy consulted to dose gentamycin.   Plan: 1. Plan to dose gentamycin for open fracture with extended interval dosing; will give 480 mg dose (based on ABW) x 1 now and order random level today at 13:00 to confirm dosing interval  2. SCr Q 72H 3. Ancef 1 gram IVq8h   Height: 5\' 5"  (165.1 cm) Weight: 200 lb (90.719 kg) IBW/kg (Calculated) : 57  Temp (24hrs), Avg:97.6 F (36.4 C), Min:97.5 F (36.4 C), Max:97.9 F (36.6 C)   Recent Labs Lab 01/09/16 1831  WBC 8.7  CREATININE 1.27*    Estimated Creatinine Clearance: 51.8 mL/min (by C-G formula based on Cr of 1.27).    No Known Allergies  Antimicrobials this admission: 4/14 Gentamycin  >>  4/13 Ancef  >>   Dose adjustments this admission: n/a  Microbiology results: n/a  Thank you for allowing pharmacy to be a part of this patient's care.  Pollyann SamplesAndy Valleri Hendricksen, PharmD, BCPS 01/10/2016, 2:18 AM Pager: [phone]

## 2016-01-10 NOTE — Care Management Obs Status (Signed)
MEDICARE OBSERVATION STATUS NOTIFICATION   Patient Details  Name: Pura SpiceDonna Reveron MRN: [id number] Date of Birth: 03/13/1956   Medicare Observation Status Notification Given:  Yes    Durenda GuthrieBrady, Rami Budhu Naomi, RN 01/10/2016, 10:16 AM
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
## 2016-01-10 NOTE — Progress Notes (Signed)
PT Cancellation Note  Patient Details Name: Pura SpiceDonna Haertel MRN: [id number] DOB: 05/04/1956   Cancelled Treatment:    Reason Eval/Treat Not Completed: Pain limiting ability to participate.  Patient declined PT x2 due to pain.  Will return tomorrow for PT evaluation.   Vena AustriaDavis, Shatoria Stooksbury H 01/10/2016, 5:54 PM Durenda HurtSusan H. Renaldo Fiddleravis, PT, [facility] Acute Rehab Services Pager [phone]

## 2016-01-11 DIAGNOSIS — N179 Acute kidney failure, unspecified: Secondary | ICD-10-CM

## 2016-01-11 DIAGNOSIS — I10 Essential (primary) hypertension: Secondary | ICD-10-CM | POA: Diagnosis not present

## 2016-01-11 DIAGNOSIS — N183 Chronic kidney disease, stage 3 (moderate): Secondary | ICD-10-CM | POA: Diagnosis not present

## 2016-01-11 DIAGNOSIS — F411 Generalized anxiety disorder: Secondary | ICD-10-CM

## 2016-01-11 DIAGNOSIS — F1910 Other psychoactive substance abuse, uncomplicated: Secondary | ICD-10-CM | POA: Diagnosis not present

## 2016-01-11 LAB — RENAL FUNCTION PANEL
ALBUMIN: 3.4 g/dL — AB (ref 3.5–5.0)
ANION GAP: 13 (ref 5–15)
BUN: 7 mg/dL (ref 6–20)
CHLORIDE: 105 mmol/L (ref 101–111)
CO2: 22 mmol/L (ref 22–32)
Calcium: 8.8 mg/dL — ABNORMAL LOW (ref 8.9–10.3)
Creatinine, Ser: 0.88 mg/dL (ref 0.44–1.00)
GFR calc non Af Amer: >60 mL/min (ref >60)
Glucose, Bld: 152 mg/dL — ABNORMAL HIGH (ref 65–99)
PHOSPHORUS: 2.1 mg/dL — AB (ref 2.5–4.6)
POTASSIUM: 3.7 mmol/L (ref 3.5–5.1)
Sodium: 140 mmol/L (ref 135–145)

## 2016-01-11 MED ORDER — LOSARTAN POTASSIUM 50 MG PO TABS
50.0000 mg | ORAL_TABLET | Freq: Every day | ORAL | Status: DC
  Administered 2016-01-11: 50 mg via ORAL
  Filled 2016-01-11: qty 1

## 2016-01-11 MED ORDER — OLMESARTAN MEDOXOMIL 40 MG PO TABS: 40.0000 mg | ORAL_TABLET | Freq: Every day | ORAL | Status: DC

## 2016-01-11 MED ORDER — AMLODIPINE BESYLATE 10 MG PO TABS: 10.0000 mg | ORAL_TABLET | Freq: Every day | ORAL | Status: AC
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
## 2016-01-11 NOTE — Progress Notes (Signed)
Subjective: 2 Days Post-Op Procedure(s) (LRB): OPEN REDUCTION INTERNAL FIXATION (ORIF) WRIST  FRACTURE (Left) IRRIGATION AND DEBRIDEMENT LEFT WRIST EXTREMITY (Left) Patient reports pain as moderate  She is with her husband at bedside.  She is tolerating her diet she is voiding well.  This is a significant open injury does replanting for full 72 hours IV antibiotics prior to DC home on by mouth antibiotics. The a patient and I discussed these issues at length.  Her husband is at bedside. I have counseled them aggressively about range of motion to the fingers massage and edema control.  Once again I feel that is difficult for her cognitively to engage a lot of these issues..    Objective: Vital signs in last 24 hours: Temp:  [97.5 F (36.4 C)-99.8 F (37.7 C)] 97.5 F (36.4 C) (04/15 0400) Pulse Rate:  [84-88] 84 (04/15 0400) Resp:  [13-18] 16 (04/15 0400) BP: (120-138)/(74-93) 129/74 mmHg (04/15 0400) SpO2:  [95 %-99 %] 99 % (04/15 0400)  Intake/Output from previous day: 04/14 0701 - 04/15 0700 In: 600 [P.O.:600] Out: -  Intake/Output this shift:     Recent Labs  01/09/16 1831  HGB 13.7    Recent Labs  01/09/16 1831  WBC 8.7  RBC 4.73  HCT 43.7  PLT 255    Recent Labs  01/09/16 1831 01/10/16 1626  NA 140 141  K 3.7 3.8  CL 105 104  CO2 22 27  BUN 19 8  CREATININE 1.27* 0.88  GLUCOSE 135* 150*  CALCIUM 9.1 8.6*   No results for input(s): LABPT, INR in the last 72 hours. Physical exam: Patient is alert and oriented in no acute distress. Patient has bandage clean dry and intact. There is no evidence of instability infection  Her range of motion to the fingers is poor. She can flex and extend through the short arc but does not want to do so due to pain  I discussed her these issues and my concerns. I spent a great deal of time today at bedside discussing with her range of motion edema control and other measures.  It will be vitally important going  forward that she moves massages and elevates her hand. She is well aware of this. ABD soft Sensation intact distally No cellulitis present Compartment soft  Assessment/Plan: 2 Days Post-Op Procedure(s) (LRB): OPEN REDUCTION INTERNAL FIXATION (ORIF) WRIST  FRACTURE (Left) IRRIGATION AND DEBRIDEMENT LEFT WRIST EXTREMITY (Left) Advance diet Up with therapy  Continue OT for range of motion edema control. Continue elevation. Continue postop antibiotics. I'll send her home on by mouth antibiotics given the open nature and contamination.  I discussed all issues with the patient.  Her husband is at bedside and understands the plan going forward.  Possible home tomorrow she is looking well.  Karen ChafeGRAMIG III,Lacey Dotson M 01/11/2016, 7:25 AM

## 2016-01-11 NOTE — Clinical Social Work Placement (Addendum)
   CLINICAL SOCIAL WORK PLACEMENT  NOTE  Date:  01/11/2016  Patient Details  Name: Pura SpiceDonna Greenlaw MRN: [id number] Date of Birth: 04/11/1956  Clinical Social Work is seeking post-discharge placement for this patient at the Skilled  Nursing Facility level of care (*CSW will initial, date and re-position this form in  chart as items are completed):  Yes   Patient/family provided with [facility] Clinical Social Work Department's list of facilities offering this level of care within the geographic area requested by the patient (or if unable, by the patient's family).  Yes   Patient/family informed of their freedom to choose among providers that offer the needed level of care, that participate in Medicare, Medicaid or managed care program needed by the patient, have an available bed and are willing to accept the patient.  Yes   Patient/family informed of [facility]'s ownership interest in [facility] and [facility], as well as of the fact that they are under no obligation to receive care at these facilities.  PASRR submitted to EDS on 01/11/16     PASRR number received on   Existing PASRR number confirmed on       FL2 transmitted to all facilities in geographic area requested by pt/family on 01/11/16     FL2 transmitted to all facilities within larger geographic area on       Patient informed that his/her managed care company has contracts with or will negotiate with certain facilities, including the following:            Patient/family informed of bed offers received.  Patient chooses bed at       Physician recommends and patient chooses bed at      Patient to be transferred to   on  .  Patient to be transferred to facility by       Patient family notified on   of transfer.  Name of family member notified:        PHYSICIAN Please sign FL2     Additional Comment:    _______________________________________________ Vaughan BrownerNixon, Kindal Ponti A, LCSW 01/11/2016, 3:43 PM

## 2016-01-11 NOTE — Progress Notes (Addendum)
OT Cancellation Note  Patient Details Name: Pura SpiceDonna Stair MRN: [id number] DOB: 06/10/1956   Cancelled Treatment:    Reason Eval/Treat Not Completed: Medical issues which prohibited therapy (pt with nausea; declining to work with OT at this time). Noted trend of pts refusal of therapy acutely; feel pt would be better served at short term SNF level care to ensure pt safety and participation in therapy. Spoke with husband re: supervision available upon return home. Husband reports he works during the day (8 hours) and pt will be un-supervised during that time. Will follow up for OT treatment as time allows and pt is appropriate.   Gaye AlkenBailey A Cammie Faulstich M.S., OTR/L Pager: [phone]  01/11/2016, 10:46 AM
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
## 2016-01-11 NOTE — Clinical Social Work Note (Signed)
Clinical Social Work Assessment  Patient Details  Name: Elizabeth Beck MRN: [id number] Date of Birth: [birth year]  Date of referral:  01/11/16               Reason for consult:  Facility Placement, Discharge Planning                Permission sought to share information with:  Family Supports, Customer service manager, Case Manager Permission granted to share information::  Yes, Verbal Permission Granted  Name::      Sheran Spine )  Agency::   (SNF's )  Relationship::   (Spouse )  Contact Information:   [phone])  Housing/Transportation Living arrangements for the past 2 months:  Single Family Home Source of Information:  Spouse Patient Interpreter Needed:  None Criminal Activity/Legal Involvement Pertinent to Current Situation/Hospitalization:  No - Comment as needed Significant Relationships:  Spouse Lives with:  Spouse Do you feel safe going back to the place where you live?  No Need for family participation in patient care:  Yes (Comment)  Care giving concerns:  PT recommending SNF placement.    Social Worker assessment / plan:  Holiday representative met with patient at bedside in reference to post-acute placement for SNF placement. CSW introduced CSW role and SNF process. Pt tearful stating that she is uncomfortable and tired. Pt requested for CSW to contact spouse to discuss discharge planning and make decisions because she "can't remember things". CSW contacted pt's spouse who stated he is agreeable to SNF placement however will not sure how willing the pt will be. Pt's husband gave CSW permission to make SNF referral however would like to further discuss options with pt before making final decision. Pt's husband does not want to force pt to d/c to SNF. Pt's husband interested in [place] area. No further concerns reported at this time. CSW will continue to follow pt and pt's family for continued support and to facilitate pt's d/c needs once medically stable.    Employment status:  Retired Nurse, adult PT Recommendations:  [place] / Referral to community resources:  [place]  Patient/Family's Response to care:  Pt a/o x4. Pt tearful and requesting for CSW to discuss d/c plans with husband. Pt's husband agreeable to SNF placement. Pt's husband pleasant and appreciated social work intervention. Pt's husband supportive and involved in pt's care.  Patient/Family's Understanding of and Emotional Response to Diagnosis, Current Treatment, and Prognosis:  Pt's husband knowledgeable of medical work up and involved in care.   Emotional Assessment Appearance:  Appears younger than stated age Attitude/Demeanor/Rapport:  Crying Affect (typically observed):  Irritable, Tearful/Crying Orientation:  Oriented to Situation, Oriented to  Time, Oriented to Place, Oriented to Self Alcohol / Substance use:  Not Applicable Psych involvement (Current and /or in the community):  No (Comment)  Discharge Needs  Concerns to be addressed:  Care Coordination Readmission within the last 30 days:  No Current discharge risk:  Dependent with Mobility Barriers to Discharge:  Continued Medical Work up   [employer], MSW, LCSWA [phone] 01/11/2016 3:42 PM

## 2016-01-11 NOTE — Progress Notes (Signed)
R Wrist reddened with multiple blisters  And is tender to touch. Pt states had piv there and blisters were present when iv removed.  Will request MD assess R Wrist in morning.  Mardene Speakarol Dontrell Stuck, RN  IV Team

## 2016-01-11 NOTE — [place] FL2 (Signed)
[place] MEDICAID FL2 LEVEL OF CARE SCREENING TOOL     IDENTIFICATION  Patient Name: Elizabeth Beck Birthdate: 08/24/1956 Sex: female Admission Date (Current Location): 01/09/2016  [facility] [place]   Facility and Address:  [facility], [address], [place], [place] [address]      Provider Number: [id number]  Attending Physician Name and Address:  Dominica SeverinWilliam Gramig, MD  Relative Name and Phone Number:       Current Level of Care: Hospital Recommended Level of Care: Skilled Nursing Facility Prior Approval Number:    Date Approved/Denied:   PASRR Number:    Discharge Plan: SNF    Current Diagnoses: Patient Active Problem List   Diagnosis Date Noted  . Essential hypertension 01/10/2016  . CKD (chronic kidney disease) stage 3, GFR 30-59 ml/min 01/10/2016  . Substance abuse 01/10/2016  . Anxiety state 01/10/2016  . Fracture, radius and ulna, proximal 01/09/2016    Orientation RESPIRATION BLADDER Height & Weight     Self, Situation, Place  Normal Continent Weight: 200 lb (90.719 kg) Height:  5\' 5"  (165.1 cm)  BEHAVIORAL SYMPTOMS/MOOD NEUROLOGICAL BOWEL NUTRITION STATUS      Continent    AMBULATORY STATUS COMMUNICATION OF NEEDS Skin   Extensive Assist Verbally Other (Comment) (incision)                       Personal Care Assistance Level of Assistance  Bathing, Feeding, Dressing Bathing Assistance: Maximum assistance Feeding assistance: Independent Dressing Assistance: Maximum assistance     Functional Limitations Info  Sight, Hearing, Speech Sight Info: Adequate Hearing Info: Adequate Speech Info: Adequate    SPECIAL CARE FACTORS FREQUENCY  PT (By licensed PT), OT (By licensed OT)                    Contractures      Additional Factors Info  Code Status, Allergies Code Status Info: Not on File Allergies Info: NKA           Current Medications (01/11/2016):  This is the current hospital  active medication list Current Facility-Administered Medications  Medication Dose Route Frequency Provider Last Rate Last Dose  . ALPRAZolam Prudy Feeler(XANAX) tablet 0.5 mg  0.5 mg Oral Q6H PRN Karie ChimeraBrian Buchanan, PA-C      . amLODipine (NORVASC) tablet 10 mg  10 mg Oral Daily Karie ChimeraBrian Buchanan, PA-C   10 mg at 01/11/16 0859  . ceFAZolin (ANCEF) IVPB 1 g/50 mL premix  1 g Intravenous 3 times per day Karie ChimeraBrian Buchanan, PA-C   1 g at 01/11/16 1316  . diphenhydrAMINE (BENADRYL) capsule 25-50 mg  25-50 mg Oral Q6H PRN Karie ChimeraBrian Buchanan, PA-C      . famotidine (PEPCID) tablet 20 mg  20 mg Oral BID PRN Karie ChimeraBrian Buchanan, PA-C      . gentamicin (GARAMYCIN) 480 mg in dextrose 5 % 100 mL IVPB  480 mg Intravenous Q24H Silvana NewnessAndrew D Meyer, RPH      . HYDROmorphone (DILAUDID) injection 0.5-1 mg  0.5-1 mg Intravenous Q2H PRN Karie ChimeraBrian Buchanan, PA-C   1 mg at 01/10/16 0644  . lactated ringers infusion   Intravenous Continuous Karie ChimeraBrian Buchanan, PA-C 75 mL/hr at 01/10/16 0145    . methocarbamol (ROBAXIN) tablet 500 mg  500 mg Oral Q6H PRN Karie ChimeraBrian Buchanan, PA-C   500 mg at 01/11/16 0900   Or  . methocarbamol (ROBAXIN) 500 mg in dextrose 5 % 50 mL IVPB  500 mg Intravenous Q6H  PRN Karie Chimera, PA-C      . ondansetron [facility]) tablet 4 mg  4 mg Oral Q6H PRN Karie Chimera, PA-C       Or  . ondansetron [facility]) injection 4 mg  4 mg Intravenous Q6H PRN Karie Chimera, PA-C      . oxyCODONE (Oxy IR/ROXICODONE) immediate release tablet 5-10 mg  5-10 mg Oral Q3H PRN Karie Chimera, PA-C   10 mg at 01/11/16 0900  . pravastatin (PRAVACHOL) tablet 40 mg  40 mg Oral q1800 Karie Chimera, PA-C   40 mg at 01/10/16 1750  . promethazine (PHENERGAN) suppository 12.5 mg  12.5 mg Rectal Q6H PRN Karie Chimera, PA-C      . senna (SENOKOT) tablet 8.6 mg  1 tablet Oral BID Dominica Severin, MD   8.6 mg at 01/11/16 0859  . traZODone (DESYREL) tablet 100 mg  100 mg Oral QHS Karie Chimera, PA-C   100 mg at 01/10/16 2130     Discharge Medications: Please see  discharge summary for a list of discharge medications.  Relevant Imaging Results:  Relevant Lab Results:   Additional Information SS#: [id number]  Loleta Dicker, LCSW

## 2016-01-12 DIAGNOSIS — N183 Chronic kidney disease, stage 3 (moderate): Secondary | ICD-10-CM

## 2016-01-12 DIAGNOSIS — I10 Essential (primary) hypertension: Secondary | ICD-10-CM | POA: Diagnosis not present

## 2016-01-12 LAB — RENAL FUNCTION PANEL
Albumin: 3 g/dL — ABNORMAL LOW (ref 3.5–5.0)
Anion gap: 10 (ref 5–15)
BUN: 8 mg/dL (ref 6–20)
CHLORIDE: 107 mmol/L (ref 101–111)
CO2: 24 mmol/L (ref 22–32)
CREATININE: 0.89 mg/dL (ref 0.44–1.00)
Calcium: 8.6 mg/dL — ABNORMAL LOW (ref 8.9–10.3)
GFR calc non Af Amer: >60 mL/min (ref >60)
Glucose, Bld: 109 mg/dL — ABNORMAL HIGH (ref 65–99)
POTASSIUM: 3.6 mmol/L (ref 3.5–5.1)
Phosphorus: 2.9 mg/dL (ref 2.5–4.6)
Sodium: 141 mmol/L (ref 135–145)

## 2016-01-12 LAB — CBC WITH DIFFERENTIAL/PLATELET
Basophils Absolute: 0 10*3/uL (ref 0.0–0.1)
Basophils Relative: 0 %
EOS ABS: 0.1 10*3/uL (ref 0.0–0.7)
Eosinophils Relative: 1 %
HCT: 37.5 % (ref 36.0–46.0)
HEMOGLOBIN: 11.6 g/dL — AB (ref 12.0–15.0)
LYMPHS ABS: 2 10*3/uL (ref 0.7–4.0)
LYMPHS PCT: 23 %
MCH: 28.5 pg (ref 26.0–34.0)
MCHC: 30.9 g/dL (ref 30.0–36.0)
MCV: 92.1 fL (ref 78.0–100.0)
MONOS PCT: 8 %
Monocytes Absolute: 0.7 10*3/uL (ref 0.1–1.0)
NEUTROS PCT: 68 %
Neutro Abs: 6.1 10*3/uL (ref 1.7–7.7)
Platelets: 168 10*3/uL (ref 150–400)
RBC: 4.07 MIL/uL (ref 3.87–5.11)
RDW: 13.8 % (ref 11.5–15.5)
WBC: 9 10*3/uL (ref 4.0–10.5)
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
## 2016-01-12 NOTE — Clinical Social Work Note (Addendum)
Clinical Social Worker continuing to follow patient and family for support and discharge planning needs.  Patient states that she is feeling "sleepy" from medication and would prefer CSW communicate with her husband.  CSW left bed offers at bedside left message with patient spouse.  CSW remains available for support and to facilitate patient discharge needs once medically stable.  Macario GoldsJesse Marquise Wicke, LCSW (Weekend Only) [phone]

## 2016-01-12 NOTE — Progress Notes (Signed)
Subjective: Patient resting in bed, awake and without complaints   Objective: Vital signs in last 24 hours: Temp:  [98.6 F (37 C)-99 F (37.2 C)] 99 F (37.2 C) (04/16 0514) Pulse Rate:  [82-94] 82 (04/16 0514) Resp:  [14-18] 18 (04/16 0514) BP: (90-141)/(47-87) 90/47 mmHg (04/16 0514) SpO2:  [93 %-95 %] 93 % (04/16 0514)  Intake/Output from previous day: 04/15 0701 - 04/16 0700 In: 240 [P.O.:240] Out: -  Intake/Output this shift:     Recent Labs  01/09/16 1831 01/12/16 0231  HGB 13.7 11.6*    Recent Labs  01/09/16 1831 01/12/16 0231  WBC 8.7 9.0  RBC 4.73 4.07  HCT 43.7 37.5  PLT 255 168    Recent Labs  01/11/16 0756 01/12/16 0231  NA 140 141  K 3.7 3.6  CL 105 107  CO2 22 24  BUN 7 8  CREATININE 0.88 0.89  GLUCOSE 152* 109*  CALCIUM 8.8* 8.6*   No results for input(s): LABPT, INR in the last 72 hours.  LUE in splint; hand elevated; moderate swelling in fingers; reluctant to move fingers but is able to move them; sensation intact  Assessment/Plan: Left wrist I & D/ORIF- Doing well. Encouraged to move fingers and keep hand elevated. Continue antibiotics   Elizabeth Beck 01/12/2016, 7:44 AM

## 2016-01-12 NOTE — Progress Notes (Signed)
Subjective: 3 Days Post-Op Procedure(s) (LRB): OPEN REDUCTION INTERNAL FIXATION (ORIF) WRIST  FRACTURE (Left) IRRIGATION AND DEBRIDEMENT LEFT WRIST EXTREMITY (Left) Patient reports pain as mild Looks much better today Tolerating diet and OOB measures(walking) Notes improved finger motion  Objective: Vital signs in last 24 hours: Temp:  [98.8 F (37.1 C)-99 F (37.2 C)] 99 F (37.2 C) (04/16 0514) Pulse Rate:  [82-87] 82 (04/16 0514) Resp:  [16-18] 18 (04/16 0514) BP: (90-129)/(47-81) 90/47 mmHg (04/16 0514) SpO2:  [93 %-94 %] 93 % (04/16 0514)  Intake/Output from previous day: 04/15 0701 - 04/16 0700 In: 240 [P.O.:240] Out: -  Intake/Output this shift: Total I/O In: 240 [P.O.:240] Out: -    Recent Labs  01/09/16 1831 01/12/16 0231  HGB 13.7 11.6*    Recent Labs  01/09/16 1831 01/12/16 0231  WBC 8.7 9.0  RBC 4.73 4.07  HCT 43.7 37.5  PLT 255 168    Recent Labs  01/11/16 0756 01/12/16 0231  NA 140 141  K 3.7 3.6  CL 105 107  CO2 22 24  BUN 7 8  CREATININE 0.88 0.89  GLUCOSE 152* 109*  CALCIUM 8.8* 8.6*   No results for input(s): LABPT, INR in the last 72 hours.  PE: better motion to the fingers and less pain ;no signs of complications The patient is alert and oriented in no acute distress. The patient complains of pain in the affected upper extremity.  The patient is noted to have a normal HEENT exam. Lung fields show equal chest expansion and no shortness of breath. Abdomen exam is nontender without distention. Lower extremity examination does not show any fracture dislocation or blood clot symptoms. Pelvis is stable and the neck and back are stable and nontender.  Assessment/Plan: 3 Days Post-Op Procedure(s) (LRB): OPEN REDUCTION INTERNAL FIXATION (ORIF) WRIST  FRACTURE (Left) IRRIGATION AND DEBRIDEMENT LEFT WRIST EXTREMITY (Left) Plan for home vs SNF Family to discuss and decisions for Monday to be made All questions answered Continue ABX  given the tenuous skin quality  Jarett Dralle III,Antawan Mchugh M 01/12/2016, 2:19 PM
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
## 2016-01-12 NOTE — Progress Notes (Signed)
PT Cancellation Note  Patient Details Name: Elizabeth Beck MRN: [id number] DOB: 09/07/1956   Cancelled Treatment:    Reason Eval/Treat Not Completed: Patient declined, no reason specifiedPt declined due to lethargy. PT will check on pt later as time allows.    Elizabeth Beck, PTA Pager: [phone]   01/12/2016, 12:04 PM

## 2016-01-13 LAB — RENAL FUNCTION PANEL
ALBUMIN: 2.8 g/dL — AB (ref 3.5–5.0)
Anion gap: 10 (ref 5–15)
BUN: 9 mg/dL (ref 6–20)
CO2: 27 mmol/L (ref 22–32)
CREATININE: 0.86 mg/dL (ref 0.44–1.00)
Calcium: 8.3 mg/dL — ABNORMAL LOW (ref 8.9–10.3)
Chloride: 105 mmol/L (ref 101–111)
GFR calc Af Amer: >60 mL/min (ref >60)
Glucose, Bld: 109 mg/dL — ABNORMAL HIGH (ref 65–99)
PHOSPHORUS: 3.3 mg/dL (ref 2.5–4.6)
Potassium: 3.3 mmol/L — ABNORMAL LOW (ref 3.5–5.1)
Sodium: 142 mmol/L (ref 135–145)

## 2016-01-13 NOTE — Progress Notes (Signed)
Subjective: 4 Days Post-Op Procedure(s) (LRB): OPEN REDUCTION INTERNAL FIXATION (ORIF) WRIST  FRACTURE (Left) IRRIGATION AND DEBRIDEMENT LEFT WRIST EXTREMITY (Left) Patient awake, discussed with patient all issues. Husband in room. She denies nausea, vomiting,fever or chills. SNF consideration pending.  Objective: Vital signs in last 24 hours: Temp:  [98.5 F (36.9 C)-98.9 F (37.2 C)] 98.5 F (36.9 C) (04/17 0456) Pulse Rate:  [79-85] 79 (04/17 0456) Resp:  [16] 16 (04/17 0456) BP: (109-124)/(66-73) 109/66 mmHg (04/17 0456) SpO2:  [94 %-99 %] 94 % (04/17 0456)  Intake/Output from previous day: 04/16 0701 - 04/17 0700 In: 600 [P.O.:600] Out: -  Intake/Output this shift: Total I/O In: 810 [P.O.:360; IV Piggyback:450] Out: -    Recent Labs  01/12/16 0231  HGB 11.6*    Recent Labs  01/12/16 0231  WBC 9.0  RBC 4.07  HCT 37.5  PLT 168    Recent Labs  01/12/16 0231 01/13/16 0356  NA 141 142  K 3.6 3.3*  CL 107 105  CO2 24 27  BUN 8 9  CREATININE 0.89 0.86  GLUCOSE 109* 109*  CALCIUM 8.6* 8.3*   No results for input(s): LABPT, INR in the last 72 hours.  Patient awake HEENT atraumatic Chest equal expansions present LUE: moderate swelling to digits and dorsal hand, sensation and refill intact, tender with any active or passive attempts at motion, no signs of infection, splint intact Assessment/Plan: 4 Days Post-Op Procedure(s) (LRB): OPEN REDUCTION INTERNAL FIXATION (ORIF) WRIST  FRACTURE (Left) IRRIGATION AND DEBRIDEMENT LEFT WRIST EXTREMITY (Left) I have discussed with the patient diligent elevation and attempts at finger flexion and extension. The patient is not engaging herself with rom exercises. I have demonstrated to she and her husband massage and active/prom.  She needs to work on this 20 to 30 times per hour during waking hours. Look toward SNF vs HHPT/OT given gait issues, deconditioning and upper extremity surgery. Plan for dressing change  tomorrow and wound change. Ly Wass L 01/13/2016, 4:41 PM
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
## 2016-01-13 NOTE — Clinical Social Work Note (Signed)
CSW spoke to patient and her husband, and [facility].  Patient and her husband said they would like to go home with home health instead.  CSW informed case manager, who will make arrangements.  Patient also did not express that she wants help with substance abuse.  CSW to sign off please reconsult if other social work needs arise.  Ervin KnackEric R. Tkeya Stencil, MSW, Theresia MajorsLCSWA [phone] 01/13/2016 5:34 PM
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
## 2016-01-14 ENCOUNTER — Encounter (HOSPITAL_COMMUNITY): Payer: Self-pay | Admitting: Orthopedic Surgery

## 2016-01-14 LAB — BASIC METABOLIC PANEL
Anion gap: 13 (ref 5–15)
BUN: 10 mg/dL (ref 6–20)
CHLORIDE: 105 mmol/L (ref 101–111)
CO2: 23 mmol/L (ref 22–32)
Calcium: 8.7 mg/dL — ABNORMAL LOW (ref 8.9–10.3)
Creatinine, Ser: 0.77 mg/dL (ref 0.44–1.00)
GFR calc non Af Amer: >60 mL/min (ref >60)
Glucose, Bld: 122 mg/dL — ABNORMAL HIGH (ref 65–99)
POTASSIUM: 3.6 mmol/L (ref 3.5–5.1)
SODIUM: 141 mmol/L (ref 135–145)

## 2016-01-14 LAB — CBC WITH DIFFERENTIAL/PLATELET
BASOS PCT: 0 %
Basophils Absolute: 0 10*3/uL (ref 0.0–0.1)
EOS ABS: 0.1 10*3/uL (ref 0.0–0.7)
Eosinophils Relative: 2 %
HEMATOCRIT: 35.3 % — AB (ref 36.0–46.0)
HEMOGLOBIN: 11.3 g/dL — AB (ref 12.0–15.0)
LYMPHS ABS: 1.1 10*3/uL (ref 0.7–4.0)
Lymphocytes Relative: 15 %
MCH: 29.1 pg (ref 26.0–34.0)
MCHC: 32 g/dL (ref 30.0–36.0)
MCV: 91 fL (ref 78.0–100.0)
MONOS PCT: 7 %
Monocytes Absolute: 0.5 10*3/uL (ref 0.1–1.0)
NEUTROS ABS: 5.9 10*3/uL (ref 1.7–7.7)
NEUTROS PCT: 76 %
Platelets: 199 10*3/uL (ref 150–400)
RBC: 3.88 MIL/uL (ref 3.87–5.11)
RDW: 13.8 % (ref 11.5–15.5)
WBC: 7.7 10*3/uL (ref 4.0–10.5)

## 2016-01-14 LAB — RENAL FUNCTION PANEL
ANION GAP: 11 (ref 5–15)
Albumin: 3 g/dL — ABNORMAL LOW (ref 3.5–5.0)
BUN: 9 mg/dL (ref 6–20)
CHLORIDE: 107 mmol/L (ref 101–111)
CO2: 24 mmol/L (ref 22–32)
CREATININE: 0.83 mg/dL (ref 0.44–1.00)
Calcium: 8.8 mg/dL — ABNORMAL LOW (ref 8.9–10.3)
Glucose, Bld: 103 mg/dL — ABNORMAL HIGH (ref 65–99)
Phosphorus: 4 mg/dL (ref 2.5–4.6)
Potassium: 3.4 mmol/L — ABNORMAL LOW (ref 3.5–5.1)
Sodium: 142 mmol/L (ref 135–145)

## 2016-01-14 NOTE — Care Management Important Message (Signed)
Important Message  Patient Details  Name: Elizabeth Beck MRN: [id number] Date of Birth: 12/01/1955   Medicare Important Message Given:  Yes    Alee Katen P Nikkie Liming 01/14/2016, 12:19 PM

## 2016-01-14 NOTE — Progress Notes (Signed)
Orthopedic Tech Progress Note Patient Details:  Elizabeth Beck 02/25/1956 [id number]  Ortho Devices Type of Ortho Device: Ace wrap, Arm sling, Long arm splint Ortho Device/Splint Interventions: Application   Saul FordyceJennifer C Thad Osoria 01/14/2016, 11:07 AM

## 2016-01-14 NOTE — Progress Notes (Signed)
Subjective: 5 Days Post-Op Procedure(s) (LRB): OPEN REDUCTION INTERNAL FIXATION (ORIF) WRIST  FRACTURE (Left) IRRIGATION AND DEBRIDEMENT LEFT WRIST EXTREMITY (Left) Patient reports pain as fairly well controlled. Her husband is in the room with her. He has no complaints this morning. She denies fever, chills, shortness of breath.   Objective: Vital signs in last 24 hours: Temp:  [96.8 F (36 C)-97.5 F (36.4 C)] 97.5 F (36.4 C) (04/18 0500) Pulse Rate:  [97] 97 (04/18 0500) Resp:  [16] 16 (04/18 0500) BP: (146-150)/(70-76) 150/70 mmHg (04/18 0500) SpO2:  [96 %] 96 % (04/18 0500)  Intake/Output from previous day: 04/17 0701 - 04/18 0700 In: 810 [P.O.:360; IV Piggyback:450] Out: -  Intake/Output this shift:     Recent Labs  01/12/16 0231  HGB 11.6*    Recent Labs  01/12/16 0231  WBC 9.0  RBC 4.07  HCT 37.5  PLT 168    Recent Labs  01/12/16 0231 01/13/16 0356  NA 141 142  K 3.6 3.3*  CL 107 105  CO2 24 27  BUN 8 9  CREATININE 0.89 0.86  GLUCOSE 109* 109*  CALCIUM 8.6* 8.3*   No results for input(s): LABPT, INR in the last 72 hours.  On physical exam she is pleasant no acute distress, husband is in the room HEENT atraumatic normocephalic Chest respirations are nonlabored equal expansions are noted Left upper extremity: The patient has continued swelling of the digits and dorsal aspect of the hand, her sensation refill are intact. She admits to not elevating it earlier this morning. She has not been moving the fingers as directed at length yesterday. Her dressings and splint are removed. The volar wounds are intact with no erythema does have notable dorsal edema of the hand and wrist. She is developing fracture blisters and in addition ulnar wound and dorsal wrist wound are irritable. She does not have dense cellulitic findings at this juncture but certainly the wounds are tense secondary to the edema present. No signs of compartment syndrome all present.  There  is no purulent discharge present. I have applied Adaptic followed by Xeroform and replace her in a long-arm splint with a volar component well-padded.   Assessment/Plan: 5 Days Post-Op Procedure(s) (LRB): OPEN REDUCTION INTERNAL FIXATION (ORIF) WRIST  FRACTURE (Left) IRRIGATION AND DEBRIDEMENT LEFT WRIST EXTREMITY (Left) Discussion with the patient greater 45 minutes face-to-face time this morning the him and it need for finger range of motion elevation and edema control in addition I have implored her husband to do this hourly as the patient will not engage herself. Is difficult to ascertain how much of her memory deficit is a cognitive disability or just simply avoidance behavior. The continued degree of swelling and wound conditions we will continue her admit for IV antibiotics close observation to make sure she does not go onto develop any frank cellulitic process and need to return to the for repeat washout. Family is going to reconsider [facility] rehabilitation upon discharge as opposed to home health care. Her discharge will be tentative based on her exam tomorrow. For now we'll continue elevation, IV antibiotics and once again hopefully range of motion to the digits and massage provided by the husband and to some degree the patient. we will repeat CBC today and be met.  Smrithi Pigford L 01/14/2016, 10:15 AM
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
## 2016-01-14 NOTE — Clinical Social Work Note (Signed)
Patient's husband now requesting [facility] be [facility]. CSW updated SNF on patient's husband request. Pending patient's medical stability, patient to possibly be discharged on 01/15/2016.  Marcelline Deistmily Desmen Schoffstall, LCSW [phone] Orthopedics: [phone] Surgical: [phone]
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
## 2016-01-15 LAB — RENAL FUNCTION PANEL
ALBUMIN: 2.6 g/dL — AB (ref 3.5–5.0)
Anion gap: 8 (ref 5–15)
BUN: 10 mg/dL (ref 6–20)
CHLORIDE: 106 mmol/L (ref 101–111)
CO2: 29 mmol/L (ref 22–32)
CREATININE: 0.9 mg/dL (ref 0.44–1.00)
Calcium: 8.7 mg/dL — ABNORMAL LOW (ref 8.9–10.3)
Glucose, Bld: 85 mg/dL (ref 65–99)
PHOSPHORUS: 3.9 mg/dL (ref 2.5–4.6)
POTASSIUM: 3.7 mmol/L (ref 3.5–5.1)
Sodium: 143 mmol/L (ref 135–145)

## 2016-01-15 NOTE — Progress Notes (Signed)
Subjective: 6 Days Post-Op Procedure(s) (LRB): OPEN REDUCTION INTERNAL FIXATION (ORIF) WRIST  FRACTURE (Left) IRRIGATION AND DEBRIDEMENT LEFT WRIST EXTREMITY (Left) Patient reports pain as minimal. Has no complaints this morning. We have discussed with her rehabilitation potential. She currently has a bed available at [facility]. He is tolerating by mouth's without difficulty, she is voiding without difficulty  Objective: Vital signs in last 24 hours: Temp:  [97.5 F (36.4 C)-98.4 F (36.9 C)] 97.5 F (36.4 C) (04/19 0610) Pulse Rate:  [88-98] 89 (04/19 0610) Resp:  [18-20] 18 (04/19 0610) BP: (131-140)/(72-92) 140/78 mmHg (04/19 0610) SpO2:  [96 %-98 %] 98 % (04/19 0610)  Intake/Output from previous day: 04/18 0701 - 04/19 0700 In: 240 [P.O.:240] Out: -  Intake/Output this shift:     Recent Labs  01/14/16 1209  HGB 11.3*    Recent Labs  01/14/16 1209  WBC 7.7  RBC 3.88  HCT 35.3*  PLT 199    Recent Labs  01/14/16 1209 01/15/16 0518  NA 141 143  K 3.6 3.7  CL 105 106  CO2 23 29  BUN 10 10  CREATININE 0.77 0.90  GLUCOSE 122* 85  CALCIUM 8.7* 8.7*   No results for input(s): LABPT, INR in the last 72 hours.  Patient is pleasant this morning, no acute distress, conversant.  HEENT atraumatic normocephalic  Chest: Mild in the expiratory wheezing present this clears with deep inspiration and expiration. Discussed with the patient the importance of using her incentive spirometer and have in addition reminded nursing staff to be diligent in making sure she does this appropriately each hour. Abdomen nontender Evaluation of the left upper extremity: Dressings were once again removed this morning. She does have some improvement in her overall swelling, however she still persists to have mild-to-moderate swelling of the digits and dorsal aspect of the hand as well as the wrist. Her wounds are cleansed. We have washed these with antibacterial soap, water and  have applied triple antibiotic ointment to the upper extremity. He has significant developing ecchymosis present. She does not have any signs of advanced cellulitis present at this juncture. Refill is intact to the digit sensation is intact. She has limited but improved range of motion of the digits today compared to yesterday.  Assessment/Plan: 6 Days Post-Op Procedure(s) (LRB): OPEN REDUCTION INTERNAL FIXATION (ORIF) WRIST  FRACTURE (Left) IRRIGATION AND DEBRIDEMENT LEFT WRIST EXTREMITY (Left) We have replaced a long arm splint to the left upper extremity well molded in nature. I have spent greater than 45 minutes with direct face-to-face time with the patient today. Once can have went over with her the importance of digital range of motion and incentive spirometer use. We will look towards tentative discharge tomorrow to [facility] pending no changes. We will need to continue IV antibiotics until the time of her discharge and of course she will need continued by mouth antibiotics or after given the open nature of her fractures. All questions were encouraged and answered  Elishia Kaczorowski L 01/15/2016, 10:31 AM
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
## 2016-01-16 ENCOUNTER — Encounter (HOSPITAL_COMMUNITY): Payer: Self-pay | Admitting: Orthopedic Surgery

## 2016-01-16 LAB — RENAL FUNCTION PANEL
ALBUMIN: 2.6 g/dL — AB (ref 3.5–5.0)
Anion gap: 11 (ref 5–15)
BUN: 10 mg/dL (ref 6–20)
CALCIUM: 8.4 mg/dL — AB (ref 8.9–10.3)
CO2: 27 mmol/L (ref 22–32)
CREATININE: 0.9 mg/dL (ref 0.44–1.00)
Chloride: 105 mmol/L (ref 101–111)
Glucose, Bld: 104 mg/dL — ABNORMAL HIGH (ref 65–99)
PHOSPHORUS: 3.9 mg/dL (ref 2.5–4.6)
Potassium: 3.7 mmol/L (ref 3.5–5.1)
SODIUM: 143 mmol/L (ref 135–145)

## 2016-01-16 MED ORDER — CEPHALEXIN 500 MG PO CAPS: 500.0000 mg | ORAL_CAPSULE | Freq: Four times a day (QID) | ORAL | Status: AC

## 2016-01-16 MED ORDER — OXYCODONE HCL 5 MG PO TABS: 5.0000 mg | ORAL_TABLET | Freq: Four times a day (QID) | ORAL | Status: AC | PRN

## 2016-01-16 NOTE — Discharge Instructions (Signed)
Keep splint clean dry and intact. In regards to occupational therapy and physical therapy: The patient will be nonweightbearing to the left upper extremity. Attempts at full finger flexion and extension should be made 30 times during waking hours. Continue massage of the digits and dorsal aspect of the hand. Elevation of the upper extremity with sedentary activities should continue. With attempts at ambulation a sling should be utilized to the left upper extremity. The patient will need a hemi-walker ambulation given her gait difficulties. She is full weightbearing to the lower extremities and right upper extremity. Therapy will need to work on overall strengthening given her generalized deconditioning, gait training, ADLs, injured motion of the left hand/fingers as well as gentle Codman's and pendulum exercises for the left shoulder.  Keep bandage clean and dry.  Call for any problems.  No smoking.  Criteria for driving a car: you should be off your pain medicine for 7-8 hours, able to drive one handed(confident), thinking clearly and feeling able in your judgement to drive. Continue elevation as it will decrease swelling.  If instructed by MD move your fingers within the confines of the bandage/splint.  Use ice if instructed by your MD. Call immediately for any sudden loss of feeling in your hand/arm or change in functional abilities of the extremity. We recommend that you to take vitamin C 1000 mg a day to promote healing. We also recommend that if you require  pain medicine that you take a stool softener to prevent constipation as most pain medicines will have constipation side effects. We recommend either Peri-Colace or Senokot and recommend that you also consider adding MiraLAX as well to prevent the constipation affects from pain medicine if you are required to use them. These medicines are over the counter and may be purchased at a local pharmacy. A cup of yogurt and a probiotic can also be helpful  during the recovery process as the medicines can disrupt your intestinal environment.

## 2016-01-16 NOTE — Progress Notes (Signed)
Reviewed discharge instructions with patient.  Called [facility] and an individual named Bonita QuinLinda placed me and hold and never came back to the phone.  Will call again.  Odetta PinkMary R Hampton Cost 01/16/2016 2:37 PM
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
## 2016-01-16 NOTE — Clinical Social Work Note (Signed)
Patient medically stable for discharge. However, patient's PASARR not completed at this time. Previous CSW started PASARR but it was not completed due to patient's request to discharge home. Patient recently changed dispositions and patient now requesting to discharge to [facility]. 30 day note on patient's chart requires MD signature. Patient may be discharged on 01/17/2016 pending approval of PASARR.  RN, RNCM, and patient's family updated regarding change in discharge date.  Marcelline Deistmily [place], LCSW [phone] Orthopedics: [phone] Surgical: [phone]

## 2016-01-16 NOTE — Clinical Social Work Note (Signed)
Patient to be [facility]. Patient's husband and daughter updated regarding discharge. Patient to be transported via EMS.  RN report number: [id number]  Marcelline Deistmily Aracelly Tencza, LCSW [phone] Orthopedics: [phone] Surgical: [phone]
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
## 2016-01-16 NOTE — Discharge Summary (Signed)
Physician Discharge Summary  Patient ID: Elizabeth Beck MRN: [id number] DOB/AGE: 1956-07-16 60 y.o.  Admit date: 01/09/2016 Discharge date:   Admission Diagnoses: forearm fracture left History reviewed. No pertinent past medical history.  Discharge Diagnoses:  Active Problems:   Fracture, radius and ulna, proximal   Essential hypertension   CKD (chronic kidney disease) stage 3, GFR 30-59 ml/min   Substance abuse   Anxiety state   Surgeries: Procedure(s): OPEN REDUCTION INTERNAL FIXATION (ORIF) WRIST  FRACTURE IRRIGATION AND DEBRIDEMENT LEFT WRIST EXTREMITY on 01/09/2016 - 01/10/2016    Consultants:    Discharged Condition: Improved  Hospital Course: Elizabeth Beck is an 60 y.o. female who was admitted 01/09/2016 with a chief complaint of Chief Complaint  Patient presents with  . Fall  , and found to have a diagnosis of forearm fracture left.  They were brought to the operating room on 01/09/2016 - 01/10/2016 and underwent Procedure(s): OPEN REDUCTION INTERNAL FIXATION (ORIF) WRIST  FRACTURE IRRIGATION AND DEBRIDEMENT LEFT WRIST EXTREMITY.    They were given perioperative antibiotics: Anti-infectives    Start     Dose/Rate Route Frequency Ordered Stop   01/16/16 0000  cephALEXin (KEFLEX) 500 MG capsule     500 mg Oral 4 times daily 01/16/16 0810     01/11/16 1630  gentamicin (GARAMYCIN) 480 mg in dextrose 5 % 100 mL IVPB     480 mg 112 mL/hr over 60 Minutes Intravenous Every 24 hours 01/10/16 2240     01/10/16 0600  ceFAZolin (ANCEF) IVPB 1 g/50 mL premix     1 g 100 mL/hr over 30 Minutes Intravenous 3 times per day 01/10/16 0135     01/10/16 0230  gentamicin (GARAMYCIN) 480 mg in dextrose 5 % 100 mL IVPB     480 mg 112 mL/hr over 60 Minutes Intravenous  Once 01/10/16 0151 01/10/16 0719   01/09/16 2000  ceFAZolin (ANCEF) IVPB 2g/100 mL premix     2 g 200 mL/hr over 30 Minutes Intravenous  Once 01/09/16 1844 01/09/16 2005    .  They were given sequential compression devices,  early ambulation.  Recent vital signs: Patient Vitals for the past 24 hrs:  BP Temp Temp src Pulse Resp SpO2  01/16/16 0625 (!) 101/59 mmHg 99.1 F (37.3 C) Oral 72 18 93 %  01/15/16 2327 114/72 mmHg 98.5 F (36.9 C) Oral 86 16 93 %  01/15/16 1444 104/67 mmHg 98.4 F (36.9 C) Oral 81 18 96 %  .  Recent laboratory studies: No results found.  Discharge Medications:     Medication List    STOP taking these medications        PRESCRIPTION MEDICATION      TAKE these medications        ALPRAZolam 0.5 MG tablet  Commonly known as:  XANAX  Take 0.5 mg by mouth 3 (three) times daily as needed for anxiety.     amLODipine 10 MG tablet  Commonly known as:  NORVASC  Take 1 tablet (10 mg total) by mouth daily.     cephALEXin 500 MG capsule  Commonly known as:  KEFLEX  Take 1 capsule (500 mg total) by mouth 4 (four) times daily.     oxyCODONE 5 MG immediate release tablet  Commonly known as:  Oxy IR/ROXICODONE  Take 1 tablet (5 mg total) by mouth every 6 (six) hours as needed for moderate pain.     pravastatin 40 MG tablet  Commonly known as:  PRAVACHOL  Take 40 mg  by mouth daily.     traZODone 100 MG tablet  Commonly known as:  DESYREL  Take 100 mg by mouth at bedtime.        Diagnostic Studies: Dg Forearm Left  01/09/2016  CLINICAL DATA:  Fall with open fracture.  Initial encounter. EXAM: LEFT FOREARM - 2 VIEW; LEFT WRIST - 2 VIEW COMPARISON:  None. FINDINGS: Highly comminuted distal radius fracture with 100% dorsal displacement and radial displacement. There is surrounding soft tissue gas consistent with open fracture. The ulnar head is also fractured and dorsally/radially displaced. No proximal forearm fracture is seen. Maintained radiocarpal alignment. Borderline scapholunate widening. No visible carpus fracture. IMPRESSION: Open and displaced intra-articular fractures of the distal radius and ulna. Electronically Signed   By: Marnee SpringJonathon  Watts M.D.   On: 01/09/2016 19:10    Dg Wrist 2 Views Left  01/09/2016  CLINICAL DATA:  Fall with open fracture.  Initial encounter. EXAM: LEFT FOREARM - 2 VIEW; LEFT WRIST - 2 VIEW COMPARISON:  None. FINDINGS: Highly comminuted distal radius fracture with 100% dorsal displacement and radial displacement. There is surrounding soft tissue gas consistent with open fracture. The ulnar head is also fractured and dorsally/radially displaced. No proximal forearm fracture is seen. Maintained radiocarpal alignment. Borderline scapholunate widening. No visible carpus fracture. IMPRESSION: Open and displaced intra-articular fractures of the distal radius and ulna. Electronically Signed   By: Marnee SpringJonathon  Watts M.D.   On: 01/09/2016 19:10    They benefited maximally from their hospital stay and there were no complications.     [facility]     Discharge Instructions    Call MD / Call 911    Complete by:  As directed   If you experience chest pain or shortness of breath, CALL 911 and be transported to the hospital emergency room.  If you develope a fever above 101 F, pus (white drainage) or increased drainage or redness at the wound, or calf pain, call your surgeon's office.     Constipation Prevention    Complete by:  As directed   Drink plenty of fluids.  Prune juice may be helpful.  You may use a stool softener, such as Colace (over the counter) 100 mg twice a day.  Use MiraLax (over the counter) for constipation as needed.     Diet - low sodium heart healthy    Complete by:  As directed      Discharge instructions    Complete by:  As directed   Please keep splint clean and dry, do not remove. Please use the sling with ambulation attempts. Take antibiotics until completed. Move fingers frequently, full flexion and extension of the fingers should be attempted. Massage the digits and dorsal aspect of the hand. Continue elevation of the extremity when sedentary. We recommend that you to take vitamin C 1000 mg a  day to promote healing. We also recommend that if you require  pain medicine that you take a stool softener to prevent constipation as most pain medicines will have constipation side effects. We recommend either Peri-Colace or Senokot and recommend that you also consider adding MiraLAX as well to prevent the constipation affects from pain medicine if you are required to use them. These medicines are over the counter and may be purchased at a local pharmacy. A cup of yogurt and a probiotic can also be helpful during the recovery process as the medicines can disrupt your intestinal environment. Keep bandage clean and dry.  Call for any problems.  No smoking.  Criteria for driving a car: you should be off your pain medicine for 7-8 hours, able to drive one handed(confident), thinking clearly and feeling able in your judgement to drive. Continue elevation as it will decrease swelling.  If instructed by MD move your fingers within the confines of the bandage/splint.  Use ice if instructed by your MD. Call immediately for any sudden loss of feeling in your hand/arm or change in functional abilities of the extremity.     Increase activity slowly as tolerated    Complete by:  As directed          In regards to physical therapy and occupational therapy: The patient may be weightbearing as tolerated to the lower extremities and her right upper extremity. Nonweightbearing to the left upper extremity. Patient will need gait training, overall conditioning for her generalized deconditioning. A hemiwalker may be utilized with the right upper extremity for attempts at ambulation. The patient should use a sling when she is attempting ambulation. Please work diligently on full flexion and extension of the digits about the left hand as well as continued elevation, edema control and massage. Codman and pendulum exercises may be performed to the left shoulder. Please keep the splint clean dry and intact and do not remove.  Patient will need assistance with ADLs. Please continue incentive spirometry every 10 minutes every hour. Follow-up Information    Follow up with Karen Chafe, MD. Schedule an appointment as soon as possible for a visit in 1 week.   Specialty:  Orthopedic Surgery   Why:  For suture removal, For wound re-check, call for any questions or concerns   Contact information:   [address] [place] [address] [phone]        Signed: Sheran Lawless 01/16/2016, 8:14 AM

## 2016-01-16 NOTE — Progress Notes (Signed)
Patient is still here.  Something happened with insurance and she is not being discharge.  Maybe tomorrow.  Elizabeth Beck 7:33 PM 01/16/2016

## 2016-01-17 ENCOUNTER — Encounter (HOSPITAL_COMMUNITY): Payer: Self-pay | Admitting: General Practice

## 2016-01-17 LAB — RENAL FUNCTION PANEL
ALBUMIN: 2.7 g/dL — AB (ref 3.5–5.0)
ANION GAP: 13 (ref 5–15)
BUN: 11 mg/dL (ref 6–20)
CALCIUM: 8.6 mg/dL — AB (ref 8.9–10.3)
CO2: 26 mmol/L (ref 22–32)
CREATININE: 0.93 mg/dL (ref 0.44–1.00)
Chloride: 103 mmol/L (ref 101–111)
GFR calc Af Amer: >60 mL/min (ref >60)
GFR calc non Af Amer: >60 mL/min (ref >60)
GLUCOSE: 86 mg/dL (ref 65–99)
Phosphorus: 3.9 mg/dL (ref 2.5–4.6)
Potassium: 4.1 mmol/L (ref 3.5–5.1)
SODIUM: 142 mmol/L (ref 135–145)
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
## 2016-01-17 NOTE — Clinical Social Work Note (Signed)
Patient will discharge today per MD order. Patient will discharge to [facility] RN to call report prior to transportation to: [phone] Transportation: [facility]  CSW sent discharge summary to SNF for review.  Packet is complete.  RN, patient and family aware of discharge plans.  Vickii PennaGina Sameerah Nachtigal, LCSW [phone]  5N1-9, 2S 15-16 and Psychiatric Service Line  Licensed Clinical Social Worker

## 2016-01-17 NOTE — Progress Notes (Signed)
Called gave [facility] LPN, Pt is transporting via [facility].  Elizabeth Beck
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
## 2016-01-17 NOTE — Clinical Social Work Note (Addendum)
10:39am- CSW received PAS[facility] [id number].  SNF notified.   9:42am- CSW submitted needed information to [facility]- FL2, H&P and 30 -day note.  CSW awaiting a determination.  Vickii PennaGina Erasmo Vertz, LCSW [phone]  5N1-9; 2S 15-16 and Hospital Psychiatric Service Line Licensed Clinical Social Worker

## 2016-01-17 NOTE — Clinical Social Work Placement (Signed)
   CLINICAL SOCIAL WORK PLACEMENT  NOTE  Date:  01/17/2016  Patient Details  Name: Elizabeth Beck MRN: [id number] Date of Birth: 06/07/1956  Clinical Social Work is seeking post-discharge placement for this patient at the Skilled  Nursing Facility level of care (*CSW will initial, date and re-position this form in  chart as items are completed):  Yes   Patient/family provided with [facility] Clinical Social Work Department's list of facilities offering this level of care within the geographic area requested by the patient (or if unable, by the patient's family).  Yes   Patient/family informed of their freedom to choose among providers that offer the needed level of care, that participate in Medicare, Medicaid or managed care program needed by the patient, have an available bed and are willing to accept the patient.  Yes   Patient/family informed of [facility]'s ownership interest in [facility] and [facility], as well as of the fact that they are under no obligation to receive care at these facilities.  PASRR submitted to EDS on 01/11/16     PASRR number received on 01/11/16     Existing PASRR number confirmed on       FL2 transmitted to all facilities in geographic area requested by pt/family on 01/11/16     FL2 transmitted to all facilities within larger geographic area on       Patient informed that his/her managed care company has contracts with or will negotiate with certain facilities, including the following:        Yes   Patient/family informed of bed offers received.  [facility]     Physician recommends and patient chooses bed at      [facility] on 01/17/16.  Patient to be transferred to facility by [facility]     Patient family notified on 01/17/16 of transfer.  Name of family member notified:  husband     PHYSICIAN Please sign FL2     Additional Comment:     _______________________________________________ Rondel BatonIngle, Zell Hylton C, LCSW 01/17/2016, 10:44 AM

## 2016-01-17 NOTE — Care Management Important Message (Signed)
Important Message  Patient Details  Name: Elizabeth SpiceDonna Leath MRN: [id number] Date of Birth: 02/02/1956   Medicare Important Message Given:  Yes    Wrenly Lauritsen P Ednah Hammock 01/17/2016, 1:29 PM

## 2016-01-21 ENCOUNTER — Encounter (HOSPITAL_COMMUNITY): Payer: Self-pay | Admitting: Orthopedic Surgery

## 2017-06-03 IMAGING — CR DG FOREARM 2V*L*
2 series · 2 of 2 positions shown · non-contrast
Comparison: None.

CLINICAL DATA: Fall with open fracture.  Initial encounter.

EXAM:
LEFT FOREARM - 2 VIEW; LEFT WRIST - 2 VIEW

[AP]
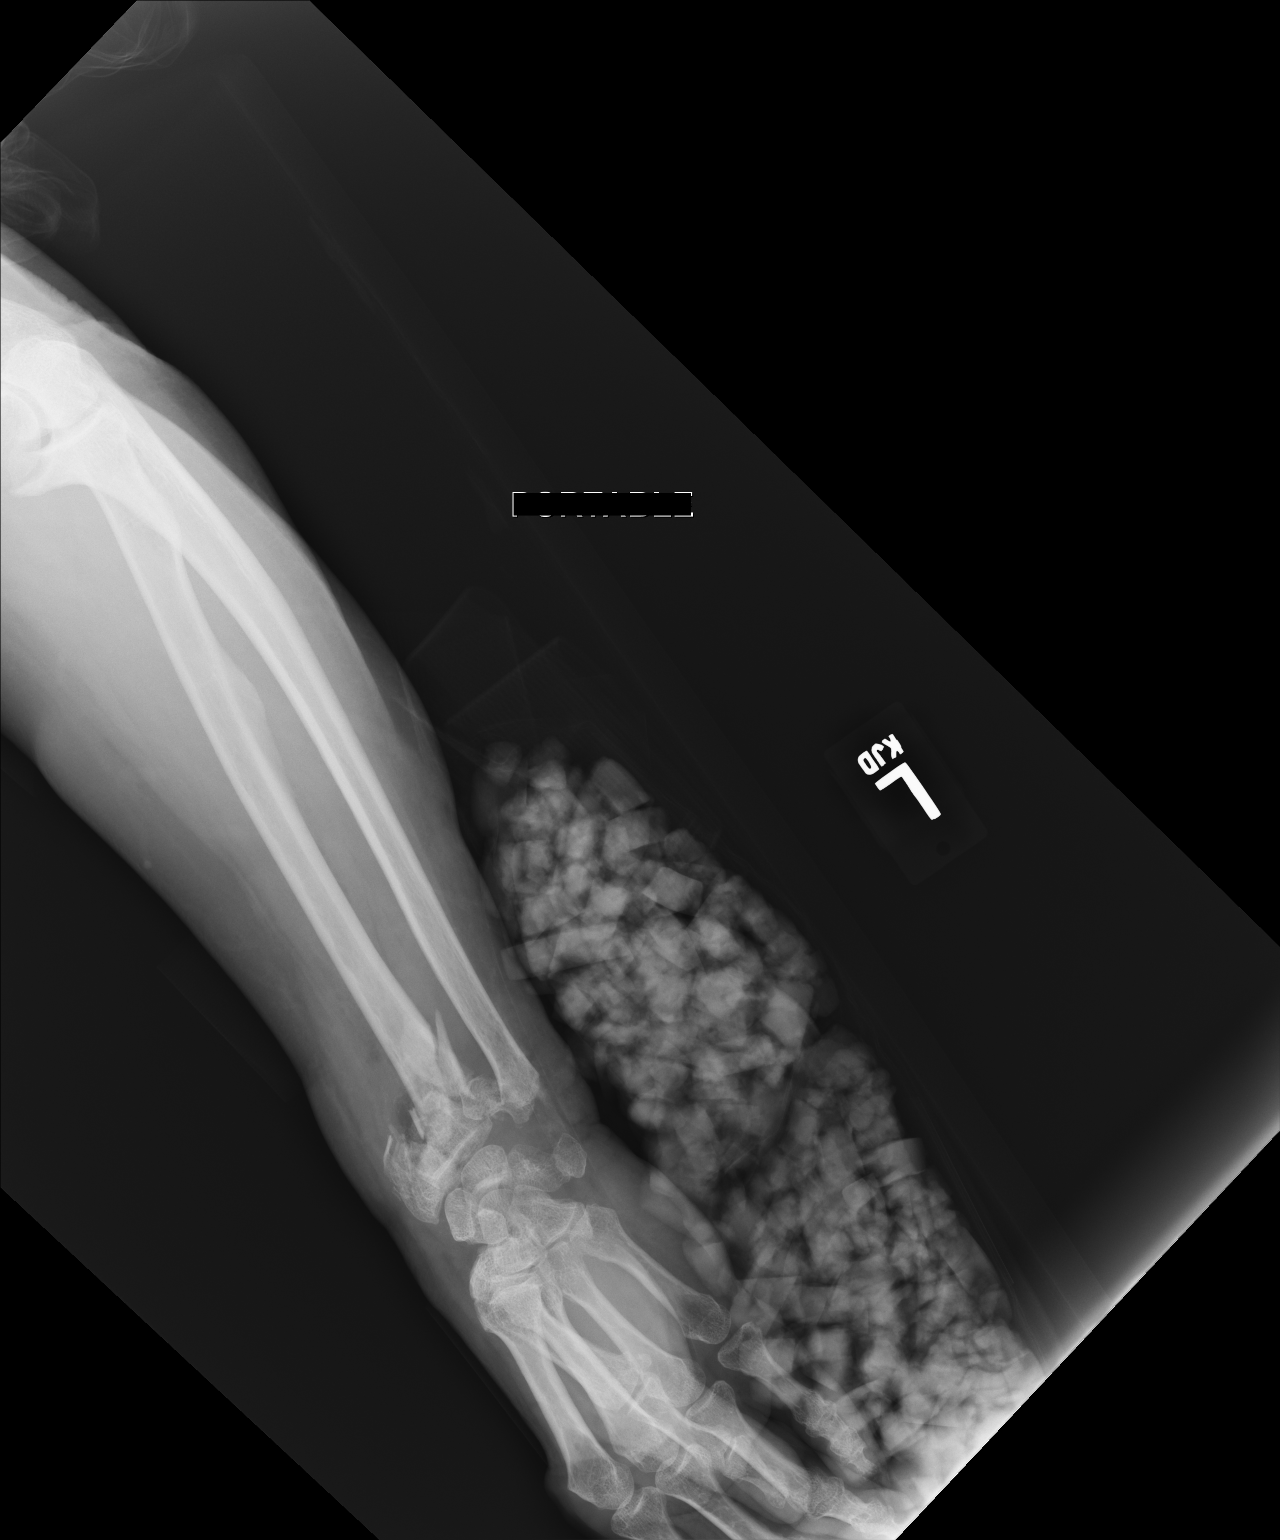

[lateral]
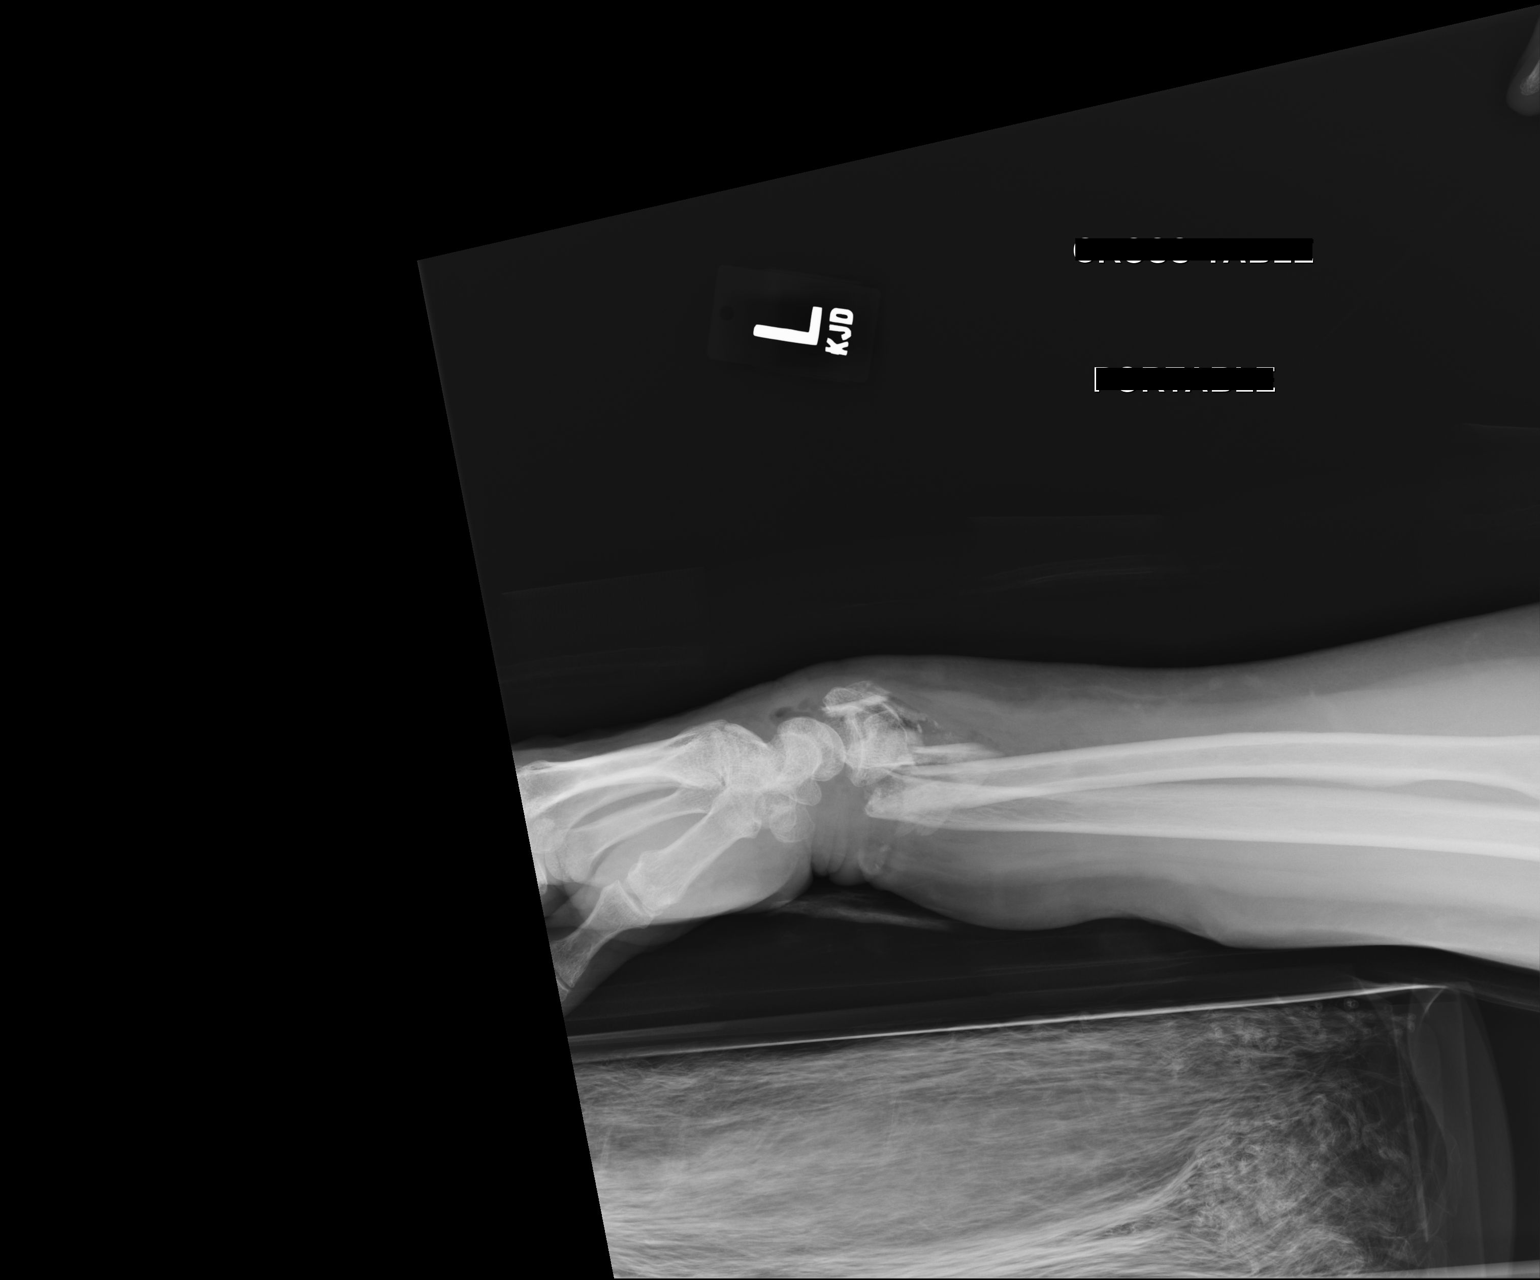

[2 of 2 positions shown; findings below may reference images not displayed]

FINDINGS: Highly comminuted distal radius fracture with 100% dorsal
displacement and radial displacement. There is surrounding soft
tissue gas consistent with open fracture.

The ulnar head is also fractured and dorsally/radially displaced.

No proximal forearm fracture is seen.

Maintained radiocarpal alignment. Borderline scapholunate widening.
No visible carpus fracture.
IMPRESSION: Open and displaced intra-articular fractures of the distal radius
and ulna.

## 2022-05-05 DIAGNOSIS — R079 Chest pain, unspecified: Secondary | ICD-10-CM | POA: Diagnosis not present

## 2022-05-05 DIAGNOSIS — I4891 Unspecified atrial fibrillation: Secondary | ICD-10-CM | POA: Diagnosis not present

## 2022-05-06 ENCOUNTER — Encounter: Payer: Self-pay | Admitting: Cardiology

## 2022-05-06 DIAGNOSIS — I4891 Unspecified atrial fibrillation: Secondary | ICD-10-CM | POA: Diagnosis not present

## 2022-05-07 ENCOUNTER — Encounter: Payer: Self-pay | Admitting: Cardiology

## 2022-05-07 DIAGNOSIS — I4891 Unspecified atrial fibrillation: Secondary | ICD-10-CM | POA: Diagnosis not present
